# Patient Record
Sex: Female | Born: 1980 | Hispanic: No | Marital: Married | Smoking: Never smoker
Health system: Southern US, Community
[De-identification: ages and names within clinical notes are randomized; demographics above are authoritative.]

## PROBLEM LIST (undated history)

## (undated) DIAGNOSIS — I951 Orthostatic hypotension: Secondary | ICD-10-CM

## (undated) DIAGNOSIS — M5020 Other cervical disc displacement, unspecified cervical region: Secondary | ICD-10-CM

## (undated) DIAGNOSIS — D580 Hereditary spherocytosis: Secondary | ICD-10-CM

## (undated) HISTORY — DX: Hereditary spherocytosis: D58.0

## (undated) HISTORY — DX: Other cervical disc displacement, unspecified cervical region: M50.20

## (undated) HISTORY — DX: Orthostatic hypotension: I95.1

---
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
## 2005-08-01 ENCOUNTER — Other Ambulatory Visit: Admission: RE | Admit: 2005-08-01 | Discharge: 2005-08-01 | Payer: Self-pay | Admitting: Obstetrics and Gynecology

## 2007-08-06 ENCOUNTER — Other Ambulatory Visit: Admission: RE | Admit: 2007-08-06 | Discharge: 2007-08-06 | Payer: Self-pay | Admitting: Obstetrics and Gynecology

## 2007-09-11 ENCOUNTER — Other Ambulatory Visit: Admission: RE | Admit: 2007-09-11 | Discharge: 2007-09-11 | Payer: Self-pay | Admitting: Obstetrics and Gynecology

## 2010-04-01 LAB — HM PAP SMEAR: HM Pap smear: NORMAL

## 2012-04-21 ENCOUNTER — Encounter: Payer: Self-pay | Admitting: Nurse Practitioner

## 2012-05-10 ENCOUNTER — Ambulatory Visit: Payer: Self-pay | Admitting: Nurse Practitioner

## 2012-05-10 ENCOUNTER — Ambulatory Visit (INDEPENDENT_AMBULATORY_CARE_PROVIDER_SITE_OTHER): Payer: BC Managed Care – PPO | Admitting: Nurse Practitioner

## 2012-05-10 ENCOUNTER — Encounter: Payer: Self-pay | Admitting: Nurse Practitioner

## 2012-05-10 VITALS — BP 120/78 | HR 72 | Resp 16 | Ht 63.0 in | Wt 180.0 lb

## 2012-05-10 DIAGNOSIS — Z309 Encounter for contraceptive management, unspecified: Secondary | ICD-10-CM

## 2012-05-10 DIAGNOSIS — Z0000 Encounter for general adult medical examination without abnormal findings: Secondary | ICD-10-CM

## 2012-05-10 DIAGNOSIS — Z01419 Encounter for gynecological examination (general) (routine) without abnormal findings: Secondary | ICD-10-CM

## 2012-05-10 DIAGNOSIS — N946 Dysmenorrhea, unspecified: Secondary | ICD-10-CM

## 2012-05-10 MED ORDER — DROSPIRENONE-ETHINYL ESTRADIOL 3-0.03 MG PO TABS: 1.0000 | ORAL_TABLET | Freq: Every day | ORAL | Status: DC

## 2012-05-10 MED ORDER — NAPROXEN 250 MG PO TABS: 250.0000 mg | ORAL_TABLET | ORAL | Status: DC | PRN

## 2012-05-10 NOTE — Patient Instructions (Addendum)

## 2012-05-10 NOTE — Progress Notes (Signed)
32 y.o. G0P0000 Caucasian Fe here for annual exam.  Menses is normal, still some dysmenorrhea and needs to take Naprosyn 4-6 tabs per month. Now on Minocycline for acne and to return to dermatologist in 8 wk's.  Planning a destination  wedding Jun 16, 2012 in [place].  About 50 members of their families are going.  Patient's last menstrual period was 05/07/2012.          Sexually active: yes  The current method of family planning is OCP (estrogen/progesterone).    Exercising: yes  Home exercise routine includes stretching. Smoker:  no  Health Maintenance: Pap:  04/01/10 MMG:  NA Colonoscopy:  NA BMD:   NA TDaP:  2008 Labs: pap and HR HPV   reports that she has never smoked. She has never used smokeless tobacco. She reports that she drinks about 0.5 ounces of alcohol per week. She reports that she does not use illicit drugs.  Past Medical History  Diagnosis Date  . Hypotension, postural     Pt. placed on Zoloft for this problem - off currently  . Spherocytosis, hereditary 1985    family members also have, father and 2 sisters and 2 nieces    Past Surgical History  Procedure Laterality Date  . 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 Ankle fracture surgery Left 1999    Current Outpatient Prescriptions  Medication Sig Dispense Refill  . minocycline (MINOCIN,DYNACIN) 100 MG capsule Take 100 mg by mouth 2 (two) times daily.      . drospirenone-ethinyl estradiol (YASMIN,ZARAH,SYEDA) 3-0.03 MG tablet Take 1 tablet by mouth daily. On Yasmin since age 18 for menorrhagia      . naproxen (NAPROSYN) 250 MG tablet Take 250 mg by mouth as needed.       No current facility-administered medications for this visit.    Family History  Problem Relation Age of Onset  . Heart disease Mother   . Other Sister     spherocytosis  . Other Sister     spherocytosis    ROS:  Pertinent items are noted in HPI.  Otherwise, a comprehensive ROS was negative.  Exam:   BP 120/78   Pulse 72  Resp 16  Ht 5\' 3"  (1.6 m)  Wt 180 lb (81.647 kg)  BMI 31.89 kg/m2  LMP 05/07/2012  Weight change: @WEIGHT  CHANGE@ Height:   Height: 5\' 3"  (160 cm)  Ht Readings from Last 3 Encounters:  05/10/12 5\' 3"  (1.6 m)    General appearance: alert, cooperative and appears stated age Head: Normocephalic, without obvious abnormality, atraumatic Neck: no adenopathy, supple, symmetrical, trachea midline and thyroid normal to inspection and palpation Lungs: clear to auscultation bilaterally Breasts: normal appearance, no masses or tenderness Heart: regular rate and rhythm Abdomen: soft, non-tender; bowel sounds normal; no masses,  no organomegaly Extremities: extremities normal, atraumatic, no cyanosis or edema Skin: Skin color, texture, turgor normal. No rashes or lesions Lymph nodes: Cervical, supraclavicular, and axillary nodes normal. No abnormal inguinal nodes palpated Neurologic: Grossly normal   Pelvic: External genitalia:  no lesions              Urethra:  normal appearing urethra with no masses, tenderness or lesions              Bartholin's and Skene's: normal                 Vagina: normal appearing vagina with normal color and discharge, no lesions  Cervix: anteverted              Pap taken: yes Bimanual Exam:  Uterus:  normal size, contour, position, consistency, mobility, non-tender              Adnexa: normal adnexa               Rectovaginal: Confirms               Anus:  normal sphincter tone, no lesions  A:  Well Woman with normal exam  No contraindications to continued OCP  Hx. Acne - trying to clear before the wedding  P:   pap smear as indicated per guidelines  return annually or prn  An After Visit Summary was printed and given to the patient.

## 2012-05-13 NOTE — Progress Notes (Signed)
Encounter reviewed by Dr. Brook Silva.  

## 2012-05-15 NOTE — Progress Notes (Signed)
Amber Yu,  [facility] pap guidelines say for the pathologists to report endometrial cells only in women 40 or over, so I am not sure why they reported them.  It is not uncommon to see endometrial cells in the first 10 - 12 days of the cycle.    If the patient has strong risk factors for endometrial cancer like irregular bleeding/anovulatory cycles, obesity, and/or family history of uterine cancer, and were not on her cycle, it could otherwise be worth investigating.  Conley Simmonds, M.D.

## 2012-07-08 DIAGNOSIS — M5020 Other cervical disc displacement, unspecified cervical region: Secondary | ICD-10-CM

## 2012-07-08 HISTORY — DX: Other cervical disc displacement, unspecified cervical region: M50.20

## 2012-07-24 ENCOUNTER — Other Ambulatory Visit: Payer: Self-pay | Admitting: Sports Medicine

## 2012-07-24 DIAGNOSIS — M5412 Radiculopathy, cervical region: Secondary | ICD-10-CM

## 2012-07-27 ENCOUNTER — Other Ambulatory Visit: Payer: Self-pay | Admitting: Neurological Surgery

## 2012-07-27 DIAGNOSIS — M542 Cervicalgia: Secondary | ICD-10-CM

## 2012-07-29 ENCOUNTER — Other Ambulatory Visit: Payer: Self-pay

## 2012-07-31 ENCOUNTER — Other Ambulatory Visit: Payer: Self-pay

## 2012-08-23 ENCOUNTER — Ambulatory Visit: Admit: 2012-08-23 | Payer: Self-pay | Admitting: Neurological Surgery

## 2012-08-23 SURGERY — ANTERIOR CERVICAL DECOMPRESSION/DISCECTOMY FUSION 1 LEVEL
Anesthesia: General

## 2013-05-16 ENCOUNTER — Ambulatory Visit (INDEPENDENT_AMBULATORY_CARE_PROVIDER_SITE_OTHER): Payer: BC Managed Care – PPO | Admitting: Nurse Practitioner

## 2013-05-16 ENCOUNTER — Encounter: Payer: Self-pay | Admitting: Nurse Practitioner

## 2013-05-16 VITALS — BP 110/76 | HR 80 | Ht 64.0 in | Wt 201.0 lb

## 2013-05-16 DIAGNOSIS — Z3009 Encounter for other general counseling and advice on contraception: Secondary | ICD-10-CM

## 2013-05-16 DIAGNOSIS — Z0000 Encounter for general adult medical examination without abnormal findings: Secondary | ICD-10-CM

## 2013-05-16 DIAGNOSIS — Z309 Encounter for contraceptive management, unspecified: Secondary | ICD-10-CM

## 2013-05-16 DIAGNOSIS — Z23 Encounter for immunization: Secondary | ICD-10-CM

## 2013-05-16 DIAGNOSIS — Z01419 Encounter for gynecological examination (general) (routine) without abnormal findings: Secondary | ICD-10-CM

## 2013-05-16 LAB — HEMOGLOBIN, FINGERSTICK: Hemoglobin, fingerstick: 15 g/dL (ref 12.0–16.0)

## 2013-05-16 MED ORDER — NAPROXEN 250 MG PO TABS: 250.0000 mg | ORAL_TABLET | ORAL | Status: DC | PRN

## 2013-05-16 MED ORDER — DROSPIRENONE-ETHINYL ESTRADIOL 3-0.03 MG PO TABS: 1.0000 | ORAL_TABLET | Freq: Every day | ORAL | Status: DC

## 2013-05-16 NOTE — Progress Notes (Signed)
Patient ID: Amber Yu, female   DOB: Nov 29, 1980, 33 y.o.   MRN: [id number] 33 y.o. G0P0 Married Caucasian Fe here for annual exam.  Got married Jun 16 2012.   Maybe considering pregnancy this summer.   Had sudden onset of cervical neck pain and saw neurosurgeon and had epidural steroid injections.  Patient's last menstrual period was 05/09/2013.          Sexually active: yes  The current method of family planning is OCP (estrogen/progesterone) and condoms all of the time.    Exercising: yes  Gym/ health club routine includes cardio and and some weight lifting 2-3 times per week. Smoker:  no  Health Maintenance: Pap:  05/10/12, WNL, neg HR HPV TD:  2005 after an injury to foot Gardasil:  completed in 2007 Labs:  HB:  15.0 Urine:  Negative    reports that she has never smoked. She has never used smokeless tobacco. She reports that she drinks about .5 ounces of alcohol per week. She reports that she does not use illicit drugs.  Past Medical History  Diagnosis Date  . Hypotension, postural     Pt. placed on Zoloft for this problem - off currently  . Spherocytosis, hereditary 1985    family members also have, father and 2 sisters and 2 nieces  . Herniated disc, cervical 07/2012    treated with 4 epidural injections    Past Surgical History  Procedure Laterality Date  . 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 Ankle fracture surgery Left 1999    Current Outpatient Prescriptions  Medication Sig Dispense Refill  . drospirenone-ethinyl estradiol (YASMIN,ZARAH,SYEDA) 3-0.03 MG tablet Take 1 tablet by mouth daily. On Yasmin since age 27 for menorrhagia  3 Package  3  . naproxen (NAPROSYN) 250 MG tablet Take 1 tablet (250 mg total) by mouth as needed.  30 tablet  12   No current facility-administered medications for this visit.    Family History  Problem Relation Age of Onset  . Heart disease Mother   . Other Sister     spherocytosis  . Other Sister    spherocytosis    ROS:  Pertinent items are noted in HPI.  Otherwise, a comprehensive ROS was negative.  Exam:   BP 110/76  Pulse 80  Ht 5\' 4"  (1.626 m)  Wt 201 lb (91.173 kg)  BMI 34.48 kg/m2  LMP 05/09/2013 Height: 5\' 4"  (162.6 cm)  Ht Readings from Last 3 Encounters:  05/16/13 5\' 4"  (1.626 m)  05/10/12 5\' 3"  (1.6 m)    General appearance: alert, cooperative and appears stated age Head: Normocephalic, without obvious abnormality, atraumatic Neck: no adenopathy, supple, symmetrical, trachea midline and thyroid normal to inspection and palpation Lungs: clear to auscultation bilaterally Breasts: normal appearance, no masses or tenderness Heart: regular rate and rhythm Abdomen: soft, non-tender; no masses,  no organomegaly Extremities: extremities normal, atraumatic, no cyanosis or edema Skin: Skin color, texture, turgor normal. No rashes or lesions Lymph nodes: Cervical, supraclavicular, and axillary nodes normal. No abnormal inguinal nodes palpated Neurologic: Grossly normal   Pelvic: External genitalia:  no lesions              Urethra:  normal appearing urethra with no masses, tenderness or lesions              Bartholin's and Skene's: normal                 Vagina: normal appearing vagina with  normal color and discharge, no lesions              Cervix: anteverted              Pap taken: no Bimanual Exam:  Uterus:  normal size, contour, position, consistency, mobility, non-tender              Adnexa: no mass, fullness, tenderness               Rectovaginal: Confirms               Anus:  normal sphincter tone, no lesions  A:  Well Woman with normal exam  Contraception with OCP  Maybe considering pregnancy this summer  P:   Pap smear as per guidelines not done  Refill on OCP - Yasmin for a year  Refill Naprosyn prn dysmenorrhea  Will check rubella titer as she is unsure of status  Will update TDaP today   Counseled on breast self exam, use and side effects of OCP's,  family planning choices, adequate intake of calcium and vitamin D, diet and exercise return annually or prn  An After Visit Summary was printed and given to the patient.

## 2013-05-16 NOTE — Patient Instructions (Signed)

## 2013-05-17 LAB — RUBELLA SCREEN: Rubella: 3.49 Index — ABNORMAL HIGH (ref <0.90)

## 2013-05-20 NOTE — Progress Notes (Signed)
Encounter reviewed by Dr. Brook Silva.  

## 2013-08-22 ENCOUNTER — Encounter: Payer: Self-pay | Admitting: Nurse Practitioner

## 2013-08-22 ENCOUNTER — Telehealth: Payer: Self-pay | Admitting: *Deleted

## 2013-08-22 ENCOUNTER — Telehealth: Payer: Self-pay | Admitting: Nurse Practitioner

## 2013-08-22 ENCOUNTER — Ambulatory Visit (INDEPENDENT_AMBULATORY_CARE_PROVIDER_SITE_OTHER): Payer: BC Managed Care – PPO | Admitting: Nurse Practitioner

## 2013-08-22 ENCOUNTER — Other Ambulatory Visit: Payer: Self-pay | Admitting: *Deleted

## 2013-08-22 VITALS — BP 136/70 | HR 80 | Temp 98.1°F | Resp 18 | Ht 64.0 in | Wt 203.0 lb

## 2013-08-22 DIAGNOSIS — R1031 Right lower quadrant pain: Secondary | ICD-10-CM

## 2013-08-22 DIAGNOSIS — N912 Amenorrhea, unspecified: Secondary | ICD-10-CM

## 2013-08-22 DIAGNOSIS — R300 Dysuria: Secondary | ICD-10-CM

## 2013-08-22 LAB — POCT URINALYSIS DIPSTICK
Bilirubin, UA: NEGATIVE
GLUCOSE UA: NEGATIVE
Ketones, UA: NEGATIVE
Leukocytes, UA: NEGATIVE
Nitrite, UA: NEGATIVE
Protein, UA: NEGATIVE
RBC UA: NEGATIVE
Urobilinogen, UA: NEGATIVE
pH, UA: 5

## 2013-08-22 LAB — CBC WITH DIFFERENTIAL/PLATELET
Basophils Absolute: 0 10*3/uL (ref 0.0–0.1)
Basophils Relative: 0 % (ref 0–1)
Eosinophils Absolute: 0.2 10*3/uL (ref 0.0–0.7)
Eosinophils Relative: 1 % (ref 0–5)
HCT: 41.7 % (ref 36.0–46.0)
HEMOGLOBIN: 15 g/dL (ref 12.0–15.0)
LYMPHS ABS: 4.2 10*3/uL — AB (ref 0.7–4.0)
Lymphocytes Relative: 26 % (ref 12–46)
MCH: 28.4 pg (ref 26.0–34.0)
MCHC: 36 g/dL (ref 30.0–36.0)
MCV: 78.8 fL (ref 78.0–100.0)
MONO ABS: 1 10*3/uL (ref 0.1–1.0)
MONOS PCT: 6 % (ref 3–12)
NEUTROS ABS: 10.8 10*3/uL — AB (ref 1.7–7.7)
Neutrophils Relative %: 67 % (ref 43–77)
Platelets: 783 10*3/uL — ABNORMAL HIGH (ref 150–400)
RBC: 5.29 MIL/uL — ABNORMAL HIGH (ref 3.87–5.11)
RDW: 13.8 % (ref 11.5–15.5)
WBC: 16.1 10*3/uL — ABNORMAL HIGH (ref 4.0–10.5)

## 2013-08-22 LAB — HCG, SERUM, QUALITATIVE: PREG SERUM: NEGATIVE

## 2013-08-22 LAB — POCT URINE PREGNANCY: Preg Test, Ur: NEGATIVE

## 2013-08-22 NOTE — Telephone Encounter (Signed)
Spoke with patient. She states she has been having increased cramping over last 5 days. Felt like she was starting her cycle and attributed cramps to that. Having pain in ovaries as well per patient. Also, feels pain with urination.  Patient states she is not taking Yasmin. Is sexually active and not trying for pregnancy but protecting from pregnancy either.  Offered office visit with Milford Cage, [place] at 1015 today and patient agreeable.  Routing to provider for final review. Patient agreeable to disposition. Will close encounter

## 2013-08-22 NOTE — Telephone Encounter (Signed)
Imaging hold placed.   

## 2013-08-22 NOTE — Patient Instructions (Signed)
Will call you with test results.

## 2013-08-22 NOTE — Telephone Encounter (Signed)
Patient is calling Says she is 6 days late and starting her cycle. She is having severe cramping and took a pregnancy test this morning but it said negative. Wants to come in and see patty to figure out what is going on.

## 2013-08-22 NOTE — Telephone Encounter (Signed)
Call to patient, notified of CT scan scheduled tomm 09-23-13 at 1230, arrive 1215. At [employer] location of [facility]. Instructed needs to go there to pick up contast medium to drink and they will give her additional instruction regarding NPO start time. Given phone number to call if questions.  Per [facility], [place], with diag of RLQ pain and elevated WBC, they usually only do the CT with contrast. She will correct order in computer.  Routing to provider for final review. Patient agreeable to disposition. Will close encounter  Gabriel Cirri, just FYI for insurance. Olivia Mackie, put in hold for report.

## 2013-08-22 NOTE — Progress Notes (Signed)
WBC ct 16K.  Serum pregnancy is negative.  RLQ pain.  Feel should proceed with CT abd/pelvis with/without contrast.  Reviewed personally.  Felipa Emory, MD.

## 2013-08-22 NOTE — Progress Notes (Signed)
Subjective:     Patient ID: Amber Yu, female   DOB: Jun 24, 1980, 33 y.o.   MRN: [id number]  HPI   This 33 yo WM Fe complains of sudden onset of right lower abdominal pressure and discomfort about 5 days ago.  She thought this was the onset of her menses and took some Advil,  Pain has persisted but not worsened.  She is very concerned about a pregnancy.  She came off OCP last June anticipating needing to have neck surgery.  She did not have the surgery but decided to give herself a break and remain off OCP and used condoms.  She has been doing that until last month; when she used no method of birth control.  Her menses on May 11 th was 9 days late. Seemed heavier flow and lasted for 7 days.  Then on June 9 th  another cycle that lasted her normal flow of 4 days.  Now, no menses yet this month.  Had some bloating and cramps about the same time of this lower pelvic pain.  Denies any urinary or fever/ chills.  She does have a little loose stool but she gets this prior to her menses. No nausea but appetite not as normal.  Home UPT was negative.   Review of Systems  Constitutional: Negative for fever, chills, diaphoresis, fatigue and unexpected weight change.  Respiratory: Negative.   Cardiovascular: Negative.   Gastrointestinal: Positive for abdominal pain. Negative for nausea, vomiting, diarrhea, constipation, blood in stool, abdominal distention, anal bleeding and rectal pain.  Genitourinary: Positive for urgency, menstrual problem and pelvic pain. Negative for dysuria, frequency, hematuria, flank pain, decreased urine volume, vaginal bleeding, vaginal discharge, genital sores, vaginal pain and dyspareunia.  Skin: Negative.   Neurological: Negative.   Psychiatric/Behavioral: Negative.        Objective:   Physical Exam  Constitutional: She is oriented to person, place, and time. She appears well-developed and well-nourished. No distress.  Abdominal: Soft. Bowel sounds are normal. She exhibits no  distension and no mass. There is tenderness. There is no rebound and no guarding.  No point tenderness - seems more vague in area of RLQ.  Genitourinary:  Normal vaginal discharge.  No signs of cervicitis or bleeding.  No pain on bimanual and no mass felt.  Unable to reproduce the same pain.  On rectal exam - no mass or constipaton. Again unablet to reproduce the same pain.  Neurological: She is alert and oriented to person, place, and time.  Psychiatric: She has a normal mood and affect. Her behavior is normal. Judgment and thought content normal.       Assessment:     RLQ pain Amenorrhea off OCP No method of BC last month    Plan:     STAT CBC and Serum HCG and follow Do not believe GYN origin - but maybe GI origin

## 2013-08-23 ENCOUNTER — Telehealth: Payer: Self-pay | Admitting: Nurse Practitioner

## 2013-08-23 ENCOUNTER — Ambulatory Visit
Admission: RE | Admit: 2013-08-23 | Discharge: 2013-08-23 | Disposition: A | Discharge status: Home / Self Care | Payer: BC Managed Care – PPO | Source: Ambulatory Visit | Attending: Obstetrics & Gynecology | Admitting: Obstetrics & Gynecology

## 2013-08-23 DIAGNOSIS — N83201 Unspecified ovarian cyst, right side: Secondary | ICD-10-CM

## 2013-08-23 DIAGNOSIS — R1031 Right lower quadrant pain: Secondary | ICD-10-CM

## 2013-08-23 MED ORDER — IOHEXOL 300 MG/ML  SOLN
125.0000 mL | Freq: Once | INTRAMUSCULAR | Status: AC | PRN
  Administered 2013-08-23: 125 mL via INTRAVENOUS

## 2013-08-23 NOTE — Telephone Encounter (Signed)
Patient calling to see if CT results are back from the test she had today.

## 2013-08-23 NOTE — Telephone Encounter (Addendum)
Patient given message from Dr. Sabra Heck.  She is agreeable to follow up as recommended. States she is still having RLQ pain but that is has improved "some".  Scheduled office visit with Milford Cage, FNP for Tuesday.  Scheduled PUS in 6 weeks with Dr. Sabra Heck.  Advised to call back any time if pain worsens, develops fevers or any other concerns. Can take Aleve as directed by Milford Cage, FNP.  Routing to [place] for precert Pelvic ultrasound. Link order after patient notification.  Routing to provider for final review. Patient agreeable to disposition. Will close encounter

## 2013-08-23 NOTE — Telephone Encounter (Signed)
Amber Yu @[facility]  [facility] is calling for insurance authorization. The patient has an appointment today @ 10 and will have to be rescheduled if this is not done.

## 2013-08-23 NOTE — Telephone Encounter (Signed)
Message copied by Michele Mcalpine on Fri Aug 23, 2013  4:45 PM ------      Message from: Megan Salon      Created: Fri Aug 23, 2013  4:44 PM       Please inform pt she has a 3.6cm right ovarian cyst that looks like it is a hemorrhagic cyst.  This means it has bled into itself.  I'd like her to see Patty again next week and have a repeat CBC done.  Also, need repeat PUS in 6-8 weeks with me to see resolution of cyst.  Thanks. ------

## 2013-08-23 NOTE — Telephone Encounter (Signed)
Routing to Dr. Sabra Heck for result note.

## 2013-08-27 ENCOUNTER — Ambulatory Visit (INDEPENDENT_AMBULATORY_CARE_PROVIDER_SITE_OTHER): Payer: BC Managed Care – PPO

## 2013-08-27 ENCOUNTER — Ambulatory Visit (INDEPENDENT_AMBULATORY_CARE_PROVIDER_SITE_OTHER): Payer: BC Managed Care – PPO | Admitting: Gynecology

## 2013-08-27 ENCOUNTER — Encounter: Payer: Self-pay | Admitting: Nurse Practitioner

## 2013-08-27 ENCOUNTER — Other Ambulatory Visit: Payer: Self-pay | Admitting: *Deleted

## 2013-08-27 ENCOUNTER — Ambulatory Visit (INDEPENDENT_AMBULATORY_CARE_PROVIDER_SITE_OTHER): Payer: BC Managed Care – PPO | Admitting: Nurse Practitioner

## 2013-08-27 VITALS — BP 116/74 | HR 76 | Temp 97.8°F | Ht 64.0 in | Wt 205.0 lb

## 2013-08-27 DIAGNOSIS — I951 Orthostatic hypotension: Secondary | ICD-10-CM | POA: Insufficient documentation

## 2013-08-27 DIAGNOSIS — D580 Hereditary spherocytosis: Secondary | ICD-10-CM | POA: Insufficient documentation

## 2013-08-27 DIAGNOSIS — Z30011 Encounter for initial prescription of contraceptive pills: Secondary | ICD-10-CM

## 2013-08-27 DIAGNOSIS — N83209 Unspecified ovarian cyst, unspecified side: Secondary | ICD-10-CM

## 2013-08-27 DIAGNOSIS — N83201 Unspecified ovarian cyst, right side: Secondary | ICD-10-CM

## 2013-08-27 DIAGNOSIS — Z3009 Encounter for other general counseling and advice on contraception: Secondary | ICD-10-CM

## 2013-08-27 DIAGNOSIS — N912 Amenorrhea, unspecified: Secondary | ICD-10-CM

## 2013-08-27 DIAGNOSIS — N831 Corpus luteum cyst of ovary, unspecified side: Secondary | ICD-10-CM

## 2013-08-27 LAB — CBC WITH DIFFERENTIAL/PLATELET
BASOS ABS: 0 10*3/uL (ref 0.0–0.1)
BASOS PCT: 0 % (ref 0–1)
EOS ABS: 0.2 10*3/uL (ref 0.0–0.7)
Eosinophils Relative: 2 % (ref 0–5)
HCT: 40.6 % (ref 36.0–46.0)
Hemoglobin: 14.4 g/dL (ref 12.0–15.0)
Lymphocytes Relative: 34 % (ref 12–46)
Lymphs Abs: 4.2 10*3/uL — ABNORMAL HIGH (ref 0.7–4.0)
MCH: 28.1 pg (ref 26.0–34.0)
MCHC: 35.5 g/dL (ref 30.0–36.0)
MCV: 79.3 fL (ref 78.0–100.0)
Monocytes Absolute: 1.1 10*3/uL — ABNORMAL HIGH (ref 0.1–1.0)
Monocytes Relative: 9 % (ref 3–12)
NEUTROS PCT: 55 % (ref 43–77)
Neutro Abs: 6.8 10*3/uL (ref 1.7–7.7)
Platelets: 806 10*3/uL — ABNORMAL HIGH (ref 150–400)
RBC: 5.12 MIL/uL — ABNORMAL HIGH (ref 3.87–5.11)
RDW: 13.9 % (ref 11.5–15.5)
WBC: 12.3 10*3/uL — ABNORMAL HIGH (ref 4.0–10.5)

## 2013-08-27 MED ORDER — MEDROXYPROGESTERONE ACETATE 10 MG PO TABS: 10.0000 mg | ORAL_TABLET | Freq: Every day | ORAL | Status: DC

## 2013-08-27 MED ORDER — DROSPIRENONE-ETHINYL ESTRADIOL 3-0.03 MG PO TABS: 1.0000 | ORAL_TABLET | Freq: Every day | ORAL | Status: DC

## 2013-08-27 NOTE — Progress Notes (Signed)
Subjective:     Patient ID: Amber Yu, female   DOB: [birth year], 33 y.o.   MRN: [id number]  HPI HPI This 33 yo WM Fe  Comes in for a recheck of a sudden onset of right lower abdominal pressure and discomfort that started about 10 days ago.   She thought this was the onset of her menses and took some Advil.  Pain has persisted but not worsened. last week as part of her evaluation she had an elevated WBC of 16.3 with a negative serum HCG.  She then went for a CT scan and was found to have 3.6 cm right hemorrhagic ovarian  cyst.  She now states pain is some better and having less pain.  She had been very concerned about a pregnancy. She came off OCP last June 2014 anticipating needing to have neck surgery. She did not have the surgery but decided to give herself a break and remain off OCP and used condoms. She has been doing that until last month; when she used no method of birth control. Her menses on May 11 th was 9 days late. Seemed heavier flow and lasted for 7 days. Then on June 9 th another cycle that lasted her normal flow of 4 days. Now, no menses yet this month. Had some bloating and cramps about the same time of this lower pelvic pain. Denies any urinary or fever/ chills.  Home UPT was negative.   Review of Systems  Constitutional: Negative for fever, chills and fatigue.  Gastrointestinal: Negative for nausea, vomiting, abdominal pain, diarrhea, constipation and abdominal distention.  Genitourinary: Negative for dysuria, flank pain, vaginal bleeding, vaginal discharge and vaginal pain.       Not SA since last appointment.  Musculoskeletal: Negative.   Skin: Negative.   Neurological: Negative.   Psychiatric/Behavioral: Negative.        Objective:   Physical Exam  Constitutional: She is oriented to person, place, and time. She appears well-developed and well-nourished. No distress.  Abdominal: Soft. She exhibits no distension. There is tenderness. There is no rebound and no guarding.   No flank pain  Genitourinary:  No vaginal discharge.  On bimanual exam very little if any pain on the left.  Increased discomfort on the right. On exam she has a "pulsating" feeling on her exam.  Not felt time after time on the left - but could feel this on her right side.  Still pain with manipulation.  I asked Dr. Charlies Constable to exam patient as well.  Neurological: She is alert and oriented to person, place, and time.  Psychiatric: She has a normal mood and affect. Her behavior is normal. Judgment and thought content normal.       Assessment:     RLQ pain X 10 days with current amenorrhea CT Scan reveals a hemorrhagic cyst on the right. History of spherocytosis with prior splenectomy 1987    Plan:     Patient seen by Dr. Charlies Constable Will get PUS today and follow Repeat CBC done stat.

## 2013-08-27 NOTE — Patient Instructions (Signed)
Wait for results of pregnancy test Start provera Start ocp when bleeding begins and stop provera

## 2013-08-27 NOTE — Progress Notes (Signed)
     Pt seen in office earlier today for folllow up of elevated WBC, right ovarian mass on CT with normal appendix, right lower quadrant pain.  Pt is using condoms for contraception, cycles were normal until recently, missed May, cycle in June, none since.  Was interested in restarting ocp. Pelvic exam felt "different" from last week per P Grubb.  PUS images reviewed with pt.     Uterus is normal 8x4.9x3.4cm EMS thickened c/w lack of menses-10. Right adnexa with simple cyst 5.8x5.5x5.3, increased in size from CT, normal peripheral blood flow.  Suspect functional cyst.  Recommend repeating PUS in 4-6w. Will repeat HCG-neg 7/17, and start menses with provera, then resume ocp. Pt agreeable Repeat cbc with decrease wbc 12.3-pt is s/p splenectomy  6m spent counseling, >50% face to face

## 2013-08-28 ENCOUNTER — Telehealth: Payer: Self-pay

## 2013-08-28 LAB — HCG, SERUM, QUALITATIVE: PREG SERUM: NEGATIVE

## 2013-08-28 NOTE — Telephone Encounter (Signed)
Message copied by Jasmine Awe on Wed Aug 28, 2013 10:00 AM ------      Message from: [place], [place]: Wed Aug 28, 2013  9:41 AM       Inform pregnancy test negative, should start provera-has rx ------

## 2013-08-28 NOTE — Telephone Encounter (Signed)
Spoke with patient. Advised of message as seen below from Dr.Lathrop. Patient agreeable and verbalizes understanding. Will start provera today.  Routing to provider for final review. Patient agreeable to disposition. Will close encounter

## 2013-08-29 NOTE — Progress Notes (Signed)
Note reviewed, agree with plan.  Soliana Kitko, MD  

## 2013-08-30 ENCOUNTER — Encounter (HOSPITAL_COMMUNITY): Payer: Self-pay

## 2013-08-30 ENCOUNTER — Telehealth: Payer: Self-pay | Admitting: Nurse Practitioner

## 2013-08-30 ENCOUNTER — Inpatient Hospital Stay (HOSPITAL_COMMUNITY)
Admission: AD | Admit: 2013-08-30 | Discharge: 2013-08-30 | Disposition: A | Discharge status: Home / Self Care | Payer: BC Managed Care – PPO | Source: Ambulatory Visit | Attending: Gynecology | Admitting: Gynecology

## 2013-08-30 DIAGNOSIS — N83201 Unspecified ovarian cyst, right side: Secondary | ICD-10-CM

## 2013-08-30 DIAGNOSIS — N83209 Unspecified ovarian cyst, unspecified side: Secondary | ICD-10-CM | POA: Insufficient documentation

## 2013-08-30 LAB — URINALYSIS, ROUTINE W REFLEX MICROSCOPIC
BILIRUBIN URINE: NEGATIVE
Glucose, UA: NEGATIVE mg/dL
Hgb urine dipstick: NEGATIVE
KETONES UR: NEGATIVE mg/dL
Leukocytes, UA: NEGATIVE
NITRITE: NEGATIVE
PH: 5 (ref 5.0–8.0)
PROTEIN: NEGATIVE mg/dL
Specific Gravity, Urine: <1.005 — ABNORMAL LOW (ref 1.005–1.030)
Urobilinogen, UA: 0.2 mg/dL (ref 0.0–1.0)

## 2013-08-30 LAB — POCT PREGNANCY, URINE: Preg Test, Ur: NEGATIVE

## 2013-08-30 NOTE — Telephone Encounter (Signed)
Patient wants to talk to a nurse about her ovarian cyst.

## 2013-08-30 NOTE — Telephone Encounter (Signed)
Spoke with patient. Patient states that she has been experiencing "A muscle spasm in my lower back and side and I hurt in the front on the side where my ovarian cyst is." Patient was seen on 4/21 with Dr.Lathrop for ultrasound. States that pain is a 5/10 and is constant. Has not taken anything for pain relief today. Denies fever, and nausea. States that she did do extra work yesterday but would like to make sure that this is "normal with a cyst." Advised would check with another provider as Dr.Lathrop has left for the day. Patient is agreeable.

## 2013-08-30 NOTE — Telephone Encounter (Signed)
Spoke with patient. Advised per nurse manager patient should start taking motrin, and use a heating pad for relief. Heating pad precautions given. Advised not to place heating pad directly on skin and do not leave plugged in while sleeping or not in the room. Advised patient to monitor pain and if pain worsens or changes or any new symptoms arise over the weekend will need to be seen at [facility]. If symptoms remain the same without worsening on Monday will need to call to be seen in the office for evaluation. Patient is agreeable and verbalizes understanding.  Routing to provider for final review. Patient agreeable to disposition. Will close encounter

## 2013-08-30 NOTE — Discharge Instructions (Signed)
Ovarian Cyst An ovarian cyst is a fluid-filled sac that forms on an ovary. The ovaries are small organs that produce eggs in women. Various types of cysts can form on the ovaries. Most are not cancerous. Many do not cause problems, and they often go away on their own. Some may cause symptoms and require treatment. Common types of ovarian cysts include:  Functional cysts--These cysts may occur every month during the menstrual cycle. This is normal. The cysts usually go away with the next menstrual cycle if the woman does not get pregnant. Usually, there are no symptoms with a functional cyst.     ### this is the type you have  ###    Endometrioma cysts--These cysts form from the tissue that lines the uterus. They are also called "chocolate cysts" because they become filled with blood that turns brown. This type of cyst can cause pain in the lower abdomen during intercourse and with your menstrual period.  Cystadenoma cysts--This type develops from the cells on the outside of the ovary. These cysts can get very big and cause lower abdomen pain and pain with intercourse. This type of cyst can twist on itself, cut off its blood supply, and cause severe pain. It can also easily rupture and cause a lot of pain.  Dermoid cysts--This type of cyst is sometimes found in both ovaries. These cysts may contain different kinds of body tissue, such as skin, teeth, hair, or cartilage. They usually do not cause symptoms unless they get very big.  Theca lutein cysts--These cysts occur when too much of a certain hormone (human chorionic gonadotropin) is produced and overstimulates the ovaries to produce an egg. This is most common after procedures used to assist with the conception of a baby (in vitro fertilization). CAUSES   Fertility drugs can cause a condition in which multiple large cysts are formed on the ovaries. This is called ovarian hyperstimulation syndrome.  A condition called polycystic ovary syndrome  can cause hormonal imbalances that can lead to nonfunctional ovarian cysts. SIGNS AND SYMPTOMS  Many ovarian cysts do not cause symptoms. If symptoms are present, they may include:  Pelvic pain or pressure.  Pain in the lower abdomen.  Pain during sexual intercourse.  Increasing girth (swelling) of the abdomen.  Abnormal menstrual periods.  Increasing pain with menstrual periods.  Stopping having menstrual periods without being pregnant. DIAGNOSIS  These cysts are commonly found during a routine or annual pelvic exam. Tests may be ordered to find out more about the cyst. These tests may include:  Ultrasound.  X-ray of the pelvis.  CT scan.  MRI.  Blood tests. TREATMENT  Many ovarian cysts go away on their own without treatment. Your health care provider may want to check your cyst regularly for 2-3 months to see if it changes. For women in menopause, it is particularly important to monitor a cyst closely because of the higher rate of ovarian cancer in menopausal women. When treatment is needed, it may include any of the following:  A procedure to drain the cyst (aspiration). This may be done using a long needle and ultrasound. It can also be done through a laparoscopic procedure. This involves using a thin, lighted tube with a tiny camera on the end (laparoscope) inserted through a small incision.  Surgery to remove the whole cyst. This may be done using laparoscopic surgery or an open surgery involving a larger incision in the lower abdomen.  Hormone treatment or birth control pills. These methods are  sometimes used to help dissolve a cyst. HOME CARE INSTRUCTIONS   Only take over-the-counter or prescription medicines as directed by your health care provider.  Follow up with your health care provider as directed.  Get regular pelvic exams and Pap tests. SEEK MEDICAL CARE IF:   Your periods are late, irregular, or painful, or they stop.  Your pelvic pain or abdominal  pain does not go away.  Your abdomen becomes larger or swollen.  You have pressure on your bladder or trouble emptying your bladder completely.  You have pain during sexual intercourse.  You have feelings of fullness, pressure, or discomfort in your stomach.  You lose weight for no apparent reason.  You feel generally ill.  You become constipated.  You lose your appetite.  You develop acne.  You have an increase in body and facial hair.  You are gaining weight, without changing your exercise and eating habits.  You think you are pregnant. SEEK IMMEDIATE MEDICAL CARE IF:   You have increasing abdominal pain.  You feel sick to your stomach (nauseous), and you throw up (vomit).  You develop a fever that comes on suddenly.  You have abdominal pain during a bowel movement.  Your menstrual periods become heavier than usual. MAKE SURE YOU:  Understand these instructions.  Will watch your condition.  Will get help right away if you are not doing well or get worse. Document Released: 01/24/2005 Document Revised: 01/29/2013 Document Reviewed: 10/01/2012 [facility] Patient Information 2015 [place], [place]. This information is not intended to replace advice given to you by your health care provider. Make sure you discuss any questions you have with your health care provider.

## 2013-08-30 NOTE — MAU Note (Signed)
I have had an ovarian cyst for couple wks. Pain was alittle different today so called my doctor and told to take Ibuprofen and if pain worsened to come here. Pain is no worse but leaving town tomorrow and just wanted to be sure it is not twisted.

## 2013-08-30 NOTE — MAU Provider Note (Signed)
History     CSN: [id number]  Arrival date and time: 08/30/13 2025   None     Chief Complaint  Patient presents with  . Ovarian Cyst   HPI This is a 33 y.o. female who presents with request to have ovarian cyst checked. Was seen this week in office by Dr Charlies Constable and diagnosed with a right ovarian cyst.  This had grown from 3cm to 5cm as of 3 days ago. States has changed from spasm feeling to cramp, and is traveling to [facility] tomorrow and wanted to make sure it was not torsed.   RN Note: I have had an ovarian cyst for couple wks. Pain was alittle different today so called my doctor and told to take Ibuprofen and if pain worsened to come here. Pain is no worse but leaving town tomorrow and just wanted to be sure it is not twisted.        OB History   Grav Para Term Preterm Abortions TAB SAB Ect Mult Living   0 0              Past Medical History  Diagnosis Date  . Hypotension, postural     Pt. placed on Zoloft for this problem - off currently  . Spherocytosis, hereditary 1985    family members also have, father and 2 sisters and 2 nieces  . Herniated disc, cervical 07/2012    treated with 4 epidural injections    Past Surgical History  Procedure Laterality Date  . 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 Ankle fracture surgery Left 1999    Family History  Problem Relation Age of Onset  . Heart disease Mother   . Other Sister     spherocytosis  . Other Sister     spherocytosis    History  Substance Use Topics  . Smoking status: Never Smoker   . Smokeless tobacco: Never Used  . Alcohol Use: 0.5 oz/week    1 drink(s) per week     Comment: once per month    Allergies:  Allergies  Allergen Reactions  . Minocycline Other (See Comments)    dizziness  . Latex Rash    Prescriptions prior to admission  Medication Sig Dispense Refill  . drospirenone-ethinyl estradiol (YASMIN,ZARAH,SYEDA) 3-0.03 MG tablet Take 1 tablet by mouth daily. On Yasmin  since age 29 for menorrhagia  3 Package  3  . medroxyPROGESTERone (PROVERA) 10 MG tablet Take 1 tablet (10 mg total) by mouth daily.  10 tablet  0  . naproxen (NAPROSYN) 250 MG tablet Take 1 tablet (250 mg total) by mouth as needed.  30 tablet  12    Review of Systems  Constitutional: Negative for fever, chills and malaise/fatigue.  Gastrointestinal: Positive for abdominal pain. Negative for nausea, vomiting, diarrhea and constipation.  Genitourinary: Negative for dysuria and flank pain.  Neurological: Negative for dizziness, weakness and headaches.   Physical Exam   Temperature 99 F (37.2 C), resp. rate 20, height 5\' 3"  (1.6 m), weight 93.25 kg (205 lb 9.3 oz), last menstrual period 07/16/2013.  Physical Exam  Constitutional: She is oriented to person, place, and time. She appears well-developed and well-nourished. No distress.  HENT:  Head: Normocephalic.  Cardiovascular: Normal rate.   Respiratory: Effort normal.  GI: Soft. She exhibits no distension and no mass. There is tenderness (slightly tender RLQ). There is no rebound and no guarding.  Genitourinary: Vagina normal and uterus normal. No vaginal discharge found.  No significant pain to palpation via bimanual exam on either adnexal region. Slight tenderness on right  Musculoskeletal: Normal range of motion.  Neurological: She is alert and oriented to person, place, and time.  Skin: Skin is warm and dry.  Psychiatric: She has a normal mood and affect.   [place] from 3 days ago states the Right ovarian simple cyst is 5cm  MAU Course  Procedures  MDM Is also on Provera for late cycle. Is on 3rd day. Told by Dr Charlies Constable that if she started bleeding, to stop taking the Provera. I described the way Provera is usually taken for 5-10 days and to expect bleeding sometime after last pill. But pt is to discuss  With Dr Charlies Constable for her instructions if bleeding starts.   Assessment and Plan  A:  Right ovarian cyst      No evidence of  torsion  P:  Discussed with Dr Phineas Real       Pt is to report any severe lower abdominal pain and go to nearest ED if occurs        Follow up in office  [facility] 08/30/2013, 10:03 PM

## 2013-10-03 ENCOUNTER — Ambulatory Visit (INDEPENDENT_AMBULATORY_CARE_PROVIDER_SITE_OTHER): Payer: BC Managed Care – PPO | Admitting: Obstetrics & Gynecology

## 2013-10-03 ENCOUNTER — Ambulatory Visit (INDEPENDENT_AMBULATORY_CARE_PROVIDER_SITE_OTHER): Payer: BC Managed Care – PPO

## 2013-10-03 VITALS — BP 136/84 | Wt 201.0 lb

## 2013-10-03 DIAGNOSIS — N938 Other specified abnormal uterine and vaginal bleeding: Secondary | ICD-10-CM

## 2013-10-03 DIAGNOSIS — N83209 Unspecified ovarian cyst, unspecified side: Secondary | ICD-10-CM

## 2013-10-03 DIAGNOSIS — N925 Other specified irregular menstruation: Secondary | ICD-10-CM

## 2013-10-03 DIAGNOSIS — N83201 Unspecified ovarian cyst, right side: Secondary | ICD-10-CM

## 2013-10-03 DIAGNOSIS — D72829 Elevated white blood cell count, unspecified: Secondary | ICD-10-CM

## 2013-10-03 DIAGNOSIS — N949 Unspecified condition associated with female genital organs and menstrual cycle: Secondary | ICD-10-CM

## 2013-10-03 NOTE — Progress Notes (Addendum)
33 y.o.Marriedfemale here for a pelvic ultrasound to reassess ovarian cyst.  Pt diagnosed 08/23/13 with 3.6cm right hemorrhagic ovarian cyst which was seen on CT scan.  Pt seen in office for follow-up.  PUS was performed 08/27/13 showing 5.8cm cyst, which was increased in sized from CT.  Due to increase in size, OCPs were recommended.  Pt was off all birth control as she was interested in starting to try for a pregnancy.  Cycle was later.  Pregnancy test was obtained and was negative.  Pt given provera withdrawal and then OCPs started.  Pt reports pain has completely resolved.  Her biggest concern was that she was told to stay on the OCPs for at least six months to a year and she really doesn't want to do this.  She really would like to try for pregnancy..   No LMP recorded. Patient is not currently having periods (Reason: Needs Pregnancy Test).  Sexually active:  yes  Contraception: oral contraceptives (estrogen/progesterone)  FINDINGS: UTERUS: 7.6 x 4.7 x 3.6cm EMS: symmetrical, no lesions noted ADNEXA:   Left ovary 2.6 x 2.0 x 1.5cm with 2.8cm cyst, thin walled, avascular with thin septation, echofree.   Right ovary 3.2 x 2.8 x 1.6cm with 1.1cm cyst, avascular but thick walled.  Located lateral and inferior aspect of right ovary. CUL DE SAC: no free fluid noted  Reviewed images with pt.  Although images are not "normal", the right ovarian cyst is much smaller or gone with presence of possible "new" 1.1cm cyst.  Either way, it is much smaller.  As pt is desirous of starting a family, advised to stop OCPs at end of pack or now.  Expect withdrawal bleeding.  Pt will keep menstrual calendar.  I am guessing she is having anovulatory cycles due to irregularity.  Day 3 FSH also discussed.  Pt to call with onset of cycle and have lab drawn.  If cycles continue to be irregular over next three months, advised F/U visit to discuss other treatment options and continued work up. Voices clear  understanding.  Assessment:  H/O 5.8cm right ovarian cyst, much small now Desired fertility/desired pregnancy H/O hereditary spherocytosis   Plan:  Stop birth control pills now. Start PNV. Expect cycle to be a little late this month (since pt is currently on the first week of pills).   Call with onset of next "real" cycle.   On day 3 of bleeding, return for fasting blood work.  Will also check for PCOS--fasting insulin, LH/FSH, 17-OHP, TSH If no cycle by day 30, pt to do home pregnancy test and call with results.  If neg, will use provera to trigger cycle and then plan Day 3 FSH.  All instructions written for pt.  Orders placed. Repeat CBC with labs Keep menstrual calendar.  ~25 minutes spent with patient >50% of time was in face to face discussion of above.

## 2013-10-03 NOTE — Patient Instructions (Signed)
Stop birth control pills now. Start PNV. Expect cycle to be a little late this month (since you are on the first week of your pills).   Call with onset of next "real" cycle.   On day 3 of bleeding, return for fasting blood work.   If no cycle by day 30, do home pregnancy test and call with results.  If neg, will use provera to trigger cycle.

## 2013-10-20 ENCOUNTER — Encounter: Payer: Self-pay | Admitting: Obstetrics & Gynecology

## 2013-10-20 NOTE — Addendum Note (Signed)
Addended by: Megan Salon on: 10/20/2013 02:39 PM   Modules accepted: Orders

## 2013-11-05 ENCOUNTER — Other Ambulatory Visit (INDEPENDENT_AMBULATORY_CARE_PROVIDER_SITE_OTHER): Payer: BC Managed Care – PPO

## 2013-11-05 DIAGNOSIS — N938 Other specified abnormal uterine and vaginal bleeding: Secondary | ICD-10-CM

## 2013-11-05 DIAGNOSIS — N949 Unspecified condition associated with female genital organs and menstrual cycle: Secondary | ICD-10-CM

## 2013-11-05 DIAGNOSIS — D72829 Elevated white blood cell count, unspecified: Secondary | ICD-10-CM

## 2013-11-05 DIAGNOSIS — N925 Other specified irregular menstruation: Secondary | ICD-10-CM

## 2013-11-05 LAB — CBC WITH DIFFERENTIAL/PLATELET
BASOS ABS: 0.1 10*3/uL (ref 0.0–0.1)
Basophils Relative: 1 % (ref 0–1)
Eosinophils Absolute: 0.3 10*3/uL (ref 0.0–0.7)
Eosinophils Relative: 3 % (ref 0–5)
HEMATOCRIT: 40.7 % (ref 36.0–46.0)
Hemoglobin: 14 g/dL (ref 12.0–15.0)
Lymphocytes Relative: 42 % (ref 12–46)
Lymphs Abs: 4.5 10*3/uL — ABNORMAL HIGH (ref 0.7–4.0)
MCH: 28.1 pg (ref 26.0–34.0)
MCHC: 34.4 g/dL (ref 30.0–36.0)
MCV: 81.6 fL (ref 78.0–100.0)
Monocytes Absolute: 1.1 10*3/uL — ABNORMAL HIGH (ref 0.1–1.0)
Monocytes Relative: 10 % (ref 3–12)
NEUTROS ABS: 4.7 10*3/uL (ref 1.7–7.7)
Neutrophils Relative %: 44 % (ref 43–77)
Platelets: 787 10*3/uL — ABNORMAL HIGH (ref 150–400)
RBC: 4.99 MIL/uL (ref 3.87–5.11)
RDW: 13.9 % (ref 11.5–15.5)
WBC: 10.6 10*3/uL — ABNORMAL HIGH (ref 4.0–10.5)

## 2013-11-06 LAB — INSULIN, FASTING: INSULIN FASTING, SERUM: 9.5 u[IU]/mL (ref 2.0–19.6)

## 2013-11-06 LAB — FSH/LH
FSH: 10.9 m[IU]/mL
LH: 5.6 m[IU]/mL

## 2013-11-06 LAB — TSH: TSH: 1.846 u[IU]/mL (ref 0.350–4.500)

## 2013-11-09 LAB — 17-HYDROXYPROGESTERONE: 17-OH-PROGESTERONE, LC/MS/MS: 77 ng/dL

## 2013-11-12 ENCOUNTER — Other Ambulatory Visit: Payer: Self-pay | Admitting: Obstetrics & Gynecology

## 2013-11-12 ENCOUNTER — Telehealth: Payer: Self-pay

## 2013-11-12 DIAGNOSIS — R7989 Other specified abnormal findings of blood chemistry: Secondary | ICD-10-CM

## 2013-11-12 NOTE — Telephone Encounter (Signed)
Lmtcb//kn 

## 2013-11-12 NOTE — Telephone Encounter (Signed)
Message copied by Robley Fries on Tue Nov 12, 2013  8:50 AM ------      Message from: Megan Salon      Created: Tue Nov 12, 2013  6:23 AM       Please call pt and inform TSH normal.  Fasting insulin normal, LH is fine.  Progesterone level is fine.  [place] is okay but on the high side of normal.  What day did she do the labs in relation to starting her cycle?            Also, I repeated her CBC as her platelet count was elevated when she first had the ovarian cyst.  It is still elevated.  I would like her to see Dr. Marin Olp just to make sure nothing else needs to be done.  I'm placing an order, too, for [place].  If she doesn't hear back from [place] about this appt within a week, please have her call back and ask for sabrina.  Thanks. ------

## 2013-11-12 NOTE — Telephone Encounter (Signed)
Patient notified of all results. See result note.//kn 

## 2013-11-19 ENCOUNTER — Telehealth: Payer: Self-pay | Admitting: Obstetrics & Gynecology

## 2013-11-19 NOTE — Telephone Encounter (Signed)
Patient has some additional questions about results she discussed with Claiborne Billings last week.

## 2013-11-21 NOTE — Telephone Encounter (Signed)
Spoke with patient-has appointment with Dr Marin Olp on 12/09/13. Is aware Dr Sabra Heck is going to talk with Dr Kerin Perna re: her [facility] level. Will call her back as soon as we know what the next step is.//kn

## 2013-12-06 ENCOUNTER — Telehealth: Payer: Self-pay | Admitting: Hematology & Oncology

## 2013-12-06 NOTE — Telephone Encounter (Signed)
Left vm w NEW PATIENT today to remind them of their appointment with Dr. Ennever. Also, advised them to bring all medication bottles and insurance card information. ° °

## 2013-12-09 ENCOUNTER — Other Ambulatory Visit (HOSPITAL_BASED_OUTPATIENT_CLINIC_OR_DEPARTMENT_OTHER): Payer: BC Managed Care – PPO | Admitting: Lab

## 2013-12-09 ENCOUNTER — Ambulatory Visit (HOSPITAL_BASED_OUTPATIENT_CLINIC_OR_DEPARTMENT_OTHER): Payer: BC Managed Care – PPO | Admitting: Hematology & Oncology

## 2013-12-09 ENCOUNTER — Ambulatory Visit (HOSPITAL_BASED_OUTPATIENT_CLINIC_OR_DEPARTMENT_OTHER): Payer: BC Managed Care – PPO

## 2013-12-09 ENCOUNTER — Ambulatory Visit: Payer: BC Managed Care – PPO

## 2013-12-09 VITALS — BP 113/67 | HR 88 | Temp 98.2°F | Resp 14 | Ht 63.0 in | Wt 195.0 lb

## 2013-12-09 DIAGNOSIS — D72829 Elevated white blood cell count, unspecified: Secondary | ICD-10-CM

## 2013-12-09 DIAGNOSIS — D580 Hereditary spherocytosis: Secondary | ICD-10-CM

## 2013-12-09 DIAGNOSIS — D473 Essential (hemorrhagic) thrombocythemia: Secondary | ICD-10-CM

## 2013-12-09 DIAGNOSIS — Z23 Encounter for immunization: Secondary | ICD-10-CM

## 2013-12-09 LAB — CBC WITH DIFFERENTIAL (CANCER CENTER ONLY)
BASO#: 0.1 10*3/uL (ref 0.0–0.2)
BASO%: 0.5 % (ref 0.0–2.0)
EOS ABS: 0.2 10*3/uL (ref 0.0–0.5)
EOS%: 1.3 % (ref 0.0–7.0)
HCT: 40.9 % (ref 34.8–46.6)
HEMOGLOBIN: 14.3 g/dL (ref 11.6–15.9)
LYMPH#: 4.8 10*3/uL — ABNORMAL HIGH (ref 0.9–3.3)
LYMPH%: 38 % (ref 14.0–48.0)
MCH: 28.8 pg (ref 26.0–34.0)
MCHC: 35 g/dL (ref 32.0–36.0)
MCV: 82 fL (ref 81–101)
MONO#: 1.4 10*3/uL — AB (ref 0.1–0.9)
MONO%: 10.9 % (ref 0.0–13.0)
NEUT#: 6.2 10*3/uL (ref 1.5–6.5)
NEUT%: 49.3 % (ref 39.6–80.0)
Platelets: 713 10*3/uL — ABNORMAL HIGH (ref 145–400)
RBC: 4.97 10*6/uL (ref 3.70–5.32)
RDW: 13.4 % (ref 11.1–15.7)
WBC: 12.5 10*3/uL — ABNORMAL HIGH (ref 3.9–10.0)

## 2013-12-09 LAB — IRON AND TIBC CHCC
%SAT: 29 % (ref 21–57)
Iron: 91 ug/dL (ref 41–142)
TIBC: 314 ug/dL (ref 236–444)
UIBC: 222 ug/dL (ref 120–384)

## 2013-12-09 LAB — FERRITIN CHCC: Ferritin: 62 ng/ml (ref 9–269)

## 2013-12-09 LAB — CHCC SATELLITE - SMEAR

## 2013-12-09 LAB — TECHNOLOGIST REVIEW CHCC SATELLITE

## 2013-12-09 MED ORDER — INFLUENZA VAC SPLIT QUAD 0.5 ML IM SUSY
0.5000 mL | PREFILLED_SYRINGE | Freq: Once | INTRAMUSCULAR | Status: AC
  Administered 2013-12-09: 0.5 mL via INTRAMUSCULAR
  Filled 2013-12-09: qty 0.5

## 2013-12-09 MED ORDER — PNEUMOCOCCAL VAC POLYVALENT 25 MCG/0.5ML IJ INJ
0.5000 mL | INJECTION | INTRAMUSCULAR | Status: AC
  Administered 2013-12-09: 0.5 mL via INTRAMUSCULAR
  Filled 2013-12-09: qty 0.5

## 2013-12-09 NOTE — Progress Notes (Signed)
Referral MD  Reason for Referral: thrombocytosis and leukocytosis                                   History of splenectomy for hereditary spherocytosis  Chief Complaint  Patient presents with  . NEW PATIENT  : I'm here to have my blood looked at  HPI: Amber Yu Is a very charming 33 year old white female. She is originally from [place]. She has a family history of hereditary spherocytosis. Her father and sisters have it. She actually had her spleen out when she was 33 years old.  She sees Dr. Sabra Heck of OB/GYN. She has some lab work done. She was having some abdominal pain and think had an ovarian cyst. She was found to have a white cell count of 12.3. Hemoglobin 14.4.her platelet count was 806. She had a normal white cell differential. This was back in July.  In September of this her, her white cell count was 10.6. Hemoglobin 14.  Her platelet count was 787. Her MCV was 82. She had a normal white cell differential.  It was felt that she needed a hematologic evaluation.  She has had no problems with infections. She is working as a Geophysicist/field seismologist. She recent had a herniated disc in her neck. She did not have surgery for. She did have some epidural injections. These were done up in [place] where her family lives.  She's had no change in bowel or bladder habits. She's had no leg swelling. She's had no rashes.  Her appetite has been good. She's had no nausea or vomiting. She has had no cough. There's been no shortness of breath.  Her last pneumonia vaccine was about 5 years ago. I told her she really needs one every 3 years. She does get her flu shot.  She is not a vegetarian. She has not noted any problems with bleeding. Her monthly cycles are pretty normal.  She has had no surgery outside of ankle surgery.  She does not smoke. She has a glass of wine once in a while.     Past Medical History  Diagnosis Date  . Hypotension, postural     Pt. placed on Zoloft for this problem - off  currently  . Spherocytosis, hereditary 1985    family members also have, father and 2 sisters and 2 nieces  . Herniated disc, cervical 07/2012    treated with 4 epidural injections  :  Past Surgical History  Procedure Laterality Date  . 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 Ankle fracture surgery Left 1999  :  Current outpatient prescriptions: ibuprofen (ADVIL) 200 MG tablet, Take 400 mg by mouth every 6 (six) hours as needed., Disp: , Rfl: :  :  Allergies  Allergen Reactions  . Minocycline Other (See Comments)    dizziness  . Latex Rash  :  Family History  Problem Relation Age of Onset  . Heart disease Mother   . Other Sister     spherocytosis  . Other Sister     spherocytosis  :  History   Social History  . Marital Status: Married    Spouse Name: N/A    Number of Children: 0  . Years of Education: N/A   Occupational History  .     Social History Main Topics  . Smoking status: Never Smoker   . Smokeless tobacco: Never Used  .  Alcohol Use: 0.5 oz/week    1 drink(s) per week     Comment: once per month  . Drug Use: No  . Sexual Activity:    Partners: Male    Birth Control/ Protection: Pill   Other Topics Concern  . Not on file   Social History Narrative  . No narrative on file  :  Pertinent items are noted in HPI.  Exam: @IPVITALS @ Well-developed and well-nourished white female in no obvious distress. Vital signs show a temperature of 98.2. Pulse 88. Blood pressure 113/67. Weight is 195 pounds. Head and neck exam shows no ocular or oral lesions. There are no palpable cervical or supraclavicular lymph nodes. Lungs are clear. Cardiac exam regular rate and rhythm with no murmurs, rubs or bruits. Axillary exam shows no bilateral axillary adenopathy. Abdomen is soft. She has a well-healed slightly scar. There is no fluid wave. There is no palpable abdominal mass. There is no palpable hepatomegaly. Back exam shows no tenderness over the  spine, ribs or hips. Extremity shows no clubbing, cyanosis or edema. She has good range of motion of her joints. She has good strength. Neurological exam is nonfocal. Skin exam shows no rashes, ecchymoses or petechia.   Recent Labs  12/09/13 1221  WBC 12.5*  HGB 14.3  HCT 40.9  PLT 713*    Recent Labs  12/09/13 1221  NA 141  K 3.8  CL 103  CO2 22  GLUCOSE 85  BUN 9  CREATININE 0.80  CALCIUM 9.8    Blood smear review: normochromic and normocytic publisher red blood cells. She has a few [employer] bodies. She has no nucleated red cells. I see no schistocytes. She has some spherocytes. There is no rouleau formation. White cells. Normal in morphology maturation. She has good maturation of her white blood cells. I see no immature myeloid or lymphoid forms. She has no atypical lymphocytes.  Platelets areincreased in number. She has several large platelets. Platelets are well granulated.  Pathology:none    Assessment and Plan: Amber Yu is a very nice 33 year old white female. She had a history of hereditary spherocytosis. She's had her spleen out.  I have to believe that the leukocytosis and thrombocytosis is all reflective of her splenectomy. I looked at her blood under the microscope and I certainly do not see anything that looked malignant or any evidence of bone marrow hyperactivity.  I suppose that she may have some iron deficiency anemia. Again she is not anemic but her iron levels might be on the low side. If so, this certainly would cause her platelets to go up.  Just to be cautious, I did send off the typical genetic mutation studies for a myeloproliferative neoplasm.this would be highly unusual. I do know that there is an increased risk of leukemia in patients whose spleens have been removed but again, I see no evidence of this.  I did tell her to take folic acid. She needs to take 1 mg of folic acid a day.  She did get her pneumonia vaccine in the office.  At  this point, I really don't think we have to get her back to be seen. This would change however if we do find that she does have a genetic mutation analysis. If she does, then I probably would have to do a bone marrow test on her.  I spent a good 45 minutes with she and her husband.  They are fine to talk to.

## 2013-12-09 NOTE — Patient Instructions (Signed)
Pneumococcal Vaccine, Polyvalent solution for injection What is this medicine? PNEUMOCOCCAL VACCINE, POLYVALENT (NEU mo KOK al vak SEEN, pol ee VEY luhnt) is a vaccine to prevent pneumococcus bacteria infection. These bacteria are a major cause of ear infections, Strep throat infections, and serious pneumonia, meningitis, or blood infections worldwide. These vaccines help the body to produce antibodies (protective substances) that help your body defend against these bacteria. This vaccine is recommended for people 61 years of age and older with health problems. It is also recommended for all adults over 73 years old. This vaccine will not treat an infection. This medicine may be used for other purposes; ask your health care provider or pharmacist if you have questions. COMMON BRAND NAME(S): Pneumovax 23 What should I tell my health care provider before I take this medicine? They need to know if you have any of these conditions: -bleeding problems -bone marrow or organ transplant -cancer, Hodgkin's disease -fever -infection -immune system problems -low platelet count in the blood -seizures -an unusual or allergic reaction to pneumococcal vaccine, diphtheria toxoid, other vaccines, latex, other medicines, foods, dyes, or preservatives -pregnant or trying to get pregnant -breast-feeding How should I use this medicine? This vaccine is for injection into a muscle or under the skin. It is given by a health care professional. A copy of Vaccine Information Statements will be given before each vaccination. Read this sheet carefully each time. The sheet may change frequently. Talk to your pediatrician regarding the use of this medicine in children. While this drug may be prescribed for children as young as 63 years of age for selected conditions, precautions do apply. Overdosage: If you think you have taken too much of this medicine contact a poison control center or emergency room at once. NOTE: This  medicine is only for you. Do not share this medicine with others. What if I miss a dose? It is important not to miss your dose. Call your doctor or health care professional if you are unable to keep an appointment. What may interact with this medicine? -medicines for cancer chemotherapy -medicines that suppress your immune function -medicines that treat or prevent blood clots like warfarin, enoxaparin, and dalteparin -steroid medicines like prednisone or cortisone This list may not describe all possible interactions. Give your health care provider a list of all the medicines, herbs, non-prescription drugs, or dietary supplements you use. Also tell them if you smoke, drink alcohol, or use illegal drugs. Some items may interact with your medicine. What should I watch for while using this medicine? Mild fever and pain should go away in 3 days or less. Report any unusual symptoms to your doctor or health care professional. What side effects may I notice from receiving this medicine? Side effects that you should report to your doctor or health care professional as soon as possible: -allergic reactions like skin rash, itching or hives, swelling of the face, lips, or tongue -breathing problems -confused -fever over 102 degrees F -pain, tingling, numbness in the hands or feet -seizures -unusual bleeding or bruising -unusual muscle weakness Side effects that usually do not require medical attention (report to your doctor or health care professional if they continue or are bothersome): -aches and pains -diarrhea -fever of 102 degrees F or less -headache -irritable -loss of appetite -pain, tender at site where injected -trouble sleeping This list may not describe all possible side effects. Call your doctor for medical advice about side effects. You may report side effects to FDA at 1-800-FDA-1088. Where should  I keep my medicine? This does not apply. This vaccine is given in a clinic, pharmacy,  doctor's office, or other health care setting and will not be stored at home. NOTE: This sheet is a summary. It may not cover all possible information. If you have questions about this medicine, talk to your doctor, pharmacist, or health care provider.  2015, Elsevier/Gold Standard. (2007-08-31 14:32:37) Influenza Virus Vaccine injection What is this medicine? INFLUENZA VIRUS VACCINE (in floo EN zuh VAHY ruhs vak SEEN) helps to reduce the risk of getting influenza also known as the flu. The vaccine only helps protect you against some strains of the flu. This medicine may be used for other purposes; ask your health care provider or pharmacist if you have questions. COMMON BRAND NAME(S): Afluria, Agriflu, Fluarix, Fluarix Quadrivalent, FLUCELVAX, Flulaval, Fluvirin, Fluzone, Fluzone High-Dose, Fluzone Intradermal What should I tell my health care provider before I take this medicine? They need to know if you have any of these conditions: -bleeding disorder like hemophilia -fever or infection -Guillain-Barre syndrome or other neurological problems -immune system problems -infection with the human immunodeficiency virus (HIV) or AIDS -low blood platelet counts -multiple sclerosis -an unusual or allergic reaction to influenza virus vaccine, latex, other medicines, foods, dyes, or preservatives. Different brands of vaccines contain different allergens. Some may contain latex or eggs. Talk to your doctor about your allergies to make sure that you get the right vaccine. -pregnant or trying to get pregnant -breast-feeding How should I use this medicine? This vaccine is for injection into a muscle or under the skin. It is given by a health care professional. A copy of Vaccine Information Statements will be given before each vaccination. Read this sheet carefully each time. The sheet may change frequently. Talk to your healthcare provider to see which vaccines are right for you. Some vaccines should not  be used in all age groups. Overdosage: If you think you have taken too much of this medicine contact a poison control center or emergency room at once. NOTE: This medicine is only for you. Do not share this medicine with others. What if I miss a dose? This does not apply. What may interact with this medicine? -chemotherapy or radiation therapy -medicines that lower your immune system like etanercept, anakinra, infliximab, and adalimumab -medicines that treat or prevent blood clots like warfarin -phenytoin -steroid medicines like prednisone or cortisone -theophylline -vaccines This list may not describe all possible interactions. Give your health care provider a list of all the medicines, herbs, non-prescription drugs, or dietary supplements you use. Also tell them if you smoke, drink alcohol, or use illegal drugs. Some items may interact with your medicine. What should I watch for while using this medicine? Report any side effects that do not go away within 3 days to your doctor or health care professional. Call your health care provider if any unusual symptoms occur within 6 weeks of receiving this vaccine. You may still catch the flu, but the illness is not usually as bad. You cannot get the flu from the vaccine. The vaccine will not protect against colds or other illnesses that may cause fever. The vaccine is needed every year. What side effects may I notice from receiving this medicine? Side effects that you should report to your doctor or health care professional as soon as possible: -allergic reactions like skin rash, itching or hives, swelling of the face, lips, or tongue Side effects that usually do not require medical attention (report to your doctor or  health care professional if they continue or are bothersome): -fever -headache -muscle aches and pains -pain, tenderness, redness, or swelling at the injection site -tiredness This list may not describe all possible side effects.  Call your doctor for medical advice about side effects. You may report side effects to FDA at 1-800-FDA-1088. Where should I keep my medicine? The vaccine will be given by a health care professional in a clinic, pharmacy, doctor's office, or other health care setting. You will not be given vaccine doses to store at home. NOTE: This sheet is a summary. It may not cover all possible information. If you have questions about this medicine, talk to your doctor, pharmacist, or health care provider.  2015, Elsevier/Gold Standard. (2011-08-04 58:09:98)

## 2013-12-10 ENCOUNTER — Telehealth: Payer: Self-pay

## 2013-12-10 ENCOUNTER — Telehealth: Payer: Self-pay | Admitting: *Deleted

## 2013-12-10 DIAGNOSIS — R109 Unspecified abdominal pain: Secondary | ICD-10-CM

## 2013-12-10 NOTE — Telephone Encounter (Signed)
Dr.Miller, first available appointment to see you will be on Thursday during an ultrasound time slot. Would you like me to schedule an office visit during this time to see you for evaluation or schedule patient for an ultrasound and consult with you that day?

## 2013-12-10 NOTE — Telephone Encounter (Signed)
Spoke with patient. Patient is agreeable to schedule PUS at this time. Appointment scheduled for Thursday 11/5 at 12:30pm with 1:15pm consult with Dr.Miller. Patient is agreeable to date and time. Patient will call back if symptoms worsen to be seen sooner for evaluation or will be seen for care overnight if needed. Order for PUS entered. Will need precert.  Cc: Felipa Emory for precert  Routing to provider for final review. Patient agreeable to disposition. Will close encounter

## 2013-12-10 NOTE — Telephone Encounter (Signed)
Spoke with patient at time of incoming call. Patient states that she has been having a "dull aching pain over my ovaries for a week or two." States that the pain is intermittent and can be only on the right side or both sides. "It is more uncomfortable than anything. I have had a cyst before so I thought I should have it checked out." Patient is not in current pain.Patient would like to come in to see Dr.Miller. Advised patient will need to speak with provider and return call with appointment date and time options. Patient is agreeable.

## 2013-12-10 NOTE — Telephone Encounter (Signed)
OK to schedule u/s on Thurs if possible.

## 2013-12-10 NOTE — Telephone Encounter (Addendum)
-----   Message from [name], MD sent at 12/09/2013  6:32 PM EST ----- Call and let her know that her iron level is okay. Amber Yu -Informed pt that iron level is good. Pt verbalized understanding.

## 2013-12-12 ENCOUNTER — Ambulatory Visit (INDEPENDENT_AMBULATORY_CARE_PROVIDER_SITE_OTHER): Payer: BC Managed Care – PPO

## 2013-12-12 ENCOUNTER — Ambulatory Visit (INDEPENDENT_AMBULATORY_CARE_PROVIDER_SITE_OTHER): Payer: BC Managed Care – PPO | Admitting: Obstetrics & Gynecology

## 2013-12-12 VITALS — BP 116/72 | Ht 63.0 in | Wt 192.0 lb

## 2013-12-12 DIAGNOSIS — N83202 Unspecified ovarian cyst, left side: Secondary | ICD-10-CM

## 2013-12-12 DIAGNOSIS — R109 Unspecified abdominal pain: Secondary | ICD-10-CM

## 2013-12-12 DIAGNOSIS — N8320 Unspecified ovarian cysts: Secondary | ICD-10-CM

## 2013-12-12 LAB — COMPREHENSIVE METABOLIC PANEL
ALBUMIN: 4.6 g/dL (ref 3.5–5.2)
ALT: 13 U/L (ref 0–35)
AST: 18 U/L (ref 0–37)
Alkaline Phosphatase: 48 U/L (ref 39–117)
BUN: 9 mg/dL (ref 6–23)
CALCIUM: 9.8 mg/dL (ref 8.4–10.5)
CHLORIDE: 103 meq/L (ref 96–112)
CO2: 22 mEq/L (ref 19–32)
Creatinine, Ser: 0.8 mg/dL (ref 0.50–1.10)
Glucose, Bld: 85 mg/dL (ref 70–99)
Potassium: 3.8 mEq/L (ref 3.5–5.3)
Sodium: 141 mEq/L (ref 135–145)
Total Bilirubin: 0.9 mg/dL (ref 0.2–1.2)
Total Protein: 7.3 g/dL (ref 6.0–8.3)

## 2013-12-12 LAB — RETICULOCYTES (CHCC)
ABS Retic: 79.4 10*3/uL (ref 19.0–186.0)
RBC.: 4.96 MIL/uL (ref 3.87–5.11)
Retic Ct Pct: 1.6 % (ref 0.4–2.3)

## 2013-12-12 LAB — VON WILLEBRAND PANEL
COAGULATION FACTOR VIII: 102 % (ref 73–140)
Ristocetin Co-factor, Plasma: 76 % (ref 42–200)
VON WILLEBRAND ANTIGEN, PLASMA: 85 % (ref 50–217)

## 2013-12-12 LAB — LACTATE DEHYDROGENASE: LDH: 206 U/L (ref 94–250)

## 2013-12-12 LAB — HAPTOGLOBIN: Haptoglobin: 56 mg/dL (ref 45–215)

## 2013-12-12 NOTE — Telephone Encounter (Signed)
D/W pt recommendations at appt 12/12/13.  See note from visit.  Encounter closed.

## 2013-12-12 NOTE — Progress Notes (Signed)
33 y.o. G0 MWF here for discussion of recent pain issues.  Pt had an ovarian cyst earlier this in the year.  She was placed on OCPs for this and the cyst resolved.  Pain has returned and she is here for PUS to see if she has another cyst.    She and spouse are considering pregnancy.  They have been trying for a short while.  Day 3 FSH was 9/15 was 10.9.  Pt and spouse have decided that they want to wait to try again for pregnancy until after pt's sister gets married next summer.  Actually, she has decided she wants to wait but wants to let me know.    Sexually active:  yes  Contraception: no method  FINDINGS: UTERUS: 7.5 x 4.4 x 3.4cm EMS: 6.49mm ADNEXA:   Left ovary 4.1 x 2.1 x 2.2cm with 3.2cm cyst with thin septation, avascular, echofree.  Possible paraovarian cyst (exactly the same as last u/s 10/03/13)   Right ovary 3.2 x 3.2 x 1.8cm with multiple peripheral follicles CUL DE SAC: no free fluid  Findings discussed with pt.  Day 3 FSH is borderline elevated which doesn't mean pt can't get pregnant but should be intentional about decisions.  She really wants to be able to go and be a part of sister's wedding.  Asks if she should restart OCPs.  I do not feel this is what she should due to ovarian suppresion that could occur.  Condom use encourage.  Also, recommended repeat Day 3 FSH in 3-6 months.  If rapidly changing, would make different recommendations at that time.  Assessment:  Left ovarian cyst, possible paraovarian Mildly elevated Day 3 FSH Plan: Repeat Day 3 FSH around AEX 4/16.  If increased further, would recommend pt see Dr. Kerin Perna.  She is in agreement with this.  No OCPs for now.  Pt will use condoms.  ~15 minutes spent with patient >50% of time was in face to face discussion of above.

## 2013-12-16 ENCOUNTER — Encounter: Payer: Self-pay | Admitting: Hematology & Oncology

## 2013-12-18 ENCOUNTER — Encounter: Payer: Self-pay | Admitting: Hematology & Oncology

## 2013-12-28 ENCOUNTER — Encounter: Payer: Self-pay | Admitting: Obstetrics & Gynecology

## 2014-02-21 ENCOUNTER — Telehealth: Payer: Self-pay | Admitting: Nurse Practitioner

## 2014-02-21 NOTE — Telephone Encounter (Signed)
Spoke with patient. Advised per last OV note with Dr.Miller patient was to have Day 3 [place] (see note below). Patient is not currently on cycle. "She told me I needed to have my progesterone level checked first and that will be day 21 like she told me." Advised patient will send a message over to [place] for orders and return call with any further recommendations. Patient is agreeable.   Assessment: Left ovarian cyst, possible paraovarian Mildly elevated Day 3 FSH Plan: Repeat Day 3 FSH around AEX 4/16. If increased further, would recommend pt see Dr. Kerin Perna. She is in agreement with this. No OCPs for now. Pt will use condoms.

## 2014-02-21 NOTE — Telephone Encounter (Signed)
Spoke with patient. Advised patient of message from [place]. Patient is agreeable and will call with first day of next menses to schedule appointment. Lab appointment for 1/19 cancelled.  Routing to provider for final review. Patient agreeable to disposition. Will close encounter

## 2014-02-21 NOTE — Telephone Encounter (Signed)
Patient was told to make an appointment for labs. Patient's next appointment 02/25/14, need orders. Patient last seen 12/12/13.

## 2014-02-21 NOTE — Telephone Encounter (Signed)
I want to check her Day 3 FSH again.  She needs to call with onset of cycle for this.

## 2014-02-25 ENCOUNTER — Other Ambulatory Visit: Payer: Self-pay

## 2014-05-07 ENCOUNTER — Telehealth: Payer: Self-pay | Admitting: Nurse Practitioner

## 2014-05-07 DIAGNOSIS — R7989 Other specified abnormal findings of blood chemistry: Secondary | ICD-10-CM

## 2014-05-07 NOTE — Telephone Encounter (Signed)
Patient states she needs to come in for some bloodwork not sure exactly what blood work she needs done.

## 2014-05-07 NOTE — Telephone Encounter (Signed)
Spoke with patient. Patient states that she started her cycle today and would like to schedule lab appointment. Appointment scheduled for day 3 on 4/1 at 9:30am. Patient is agreeable to date and time. Order placed for [facility].  Routing to provider for final review. Patient agreeable to disposition. Will close encounter

## 2014-05-07 NOTE — Telephone Encounter (Signed)
Telephone encounter from 02/21/2014 seen below with recommendations from [place].  Lyman Speller, MD at 02/21/2014 10:30 AM     Status: Signed       Expand All Collapse All   I want to check her Day 3 FSH again. She needs to call with onset of cycle for this.            Amber Awe, RN at 02/21/2014 10:14 AM     Status: Signed       Expand All Collapse All   Spoke with patient. Advised per last OV note with Dr.Miller patient was to have Day 3 [employer] (see note below). Patient is not currently on cycle. "She told me I needed to have my progesterone level checked first and that will be day 21 like she told me." Advised patient will send a message over to [place] for orders and return call with any further recommendations. Patient is agreeable.   Assessment: Left ovarian cyst, possible paraovarian Mildly elevated Day 3 FSH Plan: Repeat Day 3 FSH around AEX 4/16. If increased further, would recommend pt see Dr. Kerin Perna. She is in agreement with this. No OCPs for now. Pt will use condoms.

## 2014-05-09 ENCOUNTER — Other Ambulatory Visit (INDEPENDENT_AMBULATORY_CARE_PROVIDER_SITE_OTHER): Payer: BLUE CROSS/BLUE SHIELD

## 2014-05-09 DIAGNOSIS — R7989 Other specified abnormal findings of blood chemistry: Secondary | ICD-10-CM

## 2014-05-10 LAB — FOLLICLE STIMULATING HORMONE: FSH: 11.2 m[IU]/mL

## 2014-05-14 ENCOUNTER — Telehealth: Payer: Self-pay | Admitting: Obstetrics & Gynecology

## 2014-05-14 ENCOUNTER — Telehealth: Payer: Self-pay

## 2014-05-14 NOTE — Telephone Encounter (Signed)
-----   Message from Megan Salon, MD sent at 05/14/2014 10:42 AM EDT ----- Please inform pt her day 3 FSH is a tiny bit higher (10.9 to 11.2) than it was.  She and her husband were interested last year in pursuing pregnancy and then postponed due to upcoming travel opportunities.  I think she should see Dr. Kerin Perna for a plan when she is ready to start trying.  She will not be surprised by this as we discussed at her last visit.  I want to optimize her time when she is ready to start.  Please make appt for her.  Thanks.

## 2014-05-14 NOTE — Telephone Encounter (Signed)
Patient calling requesting to speak with the nurse

## 2014-05-14 NOTE — Telephone Encounter (Signed)
Spoke with patient. Advised patient of message as seen below from [place]. Patient is agreeable and verbalizes understanding. Patient states she is not yet ready to try for pregnancy and would like to wait to schedule appointment with Dr.Yalcinkaya. Will call back when she would like appointment.  Routing to provider for final review. Patient agreeable to disposition. Will close encounter

## 2014-05-20 ENCOUNTER — Ambulatory Visit (INDEPENDENT_AMBULATORY_CARE_PROVIDER_SITE_OTHER): Payer: BLUE CROSS/BLUE SHIELD | Admitting: Nurse Practitioner

## 2014-05-20 ENCOUNTER — Encounter: Payer: Self-pay | Admitting: Nurse Practitioner

## 2014-05-20 VITALS — BP 124/76 | HR 64 | Ht 63.5 in | Wt 181.0 lb

## 2014-05-20 DIAGNOSIS — D75839 Thrombocytosis, unspecified: Secondary | ICD-10-CM

## 2014-05-20 DIAGNOSIS — R8290 Unspecified abnormal findings in urine: Secondary | ICD-10-CM

## 2014-05-20 DIAGNOSIS — D72829 Elevated white blood cell count, unspecified: Secondary | ICD-10-CM

## 2014-05-20 DIAGNOSIS — Z01419 Encounter for gynecological examination (general) (routine) without abnormal findings: Secondary | ICD-10-CM | POA: Diagnosis not present

## 2014-05-20 DIAGNOSIS — D473 Essential (hemorrhagic) thrombocythemia: Secondary | ICD-10-CM | POA: Diagnosis not present

## 2014-05-20 MED ORDER — IBUPROFEN 800 MG PO TABS: 800.0000 mg | ORAL_TABLET | Freq: Three times a day (TID) | ORAL | Status: DC

## 2014-05-20 NOTE — Progress Notes (Signed)
Patient ID: Amber Yu, female   DOB: 25-May-1980, 34 y.o.   MRN: [id number] 33 y.o. G0P0 Married  Caucasian Fe here for annual exam.  Off OCP since 09/2013.  Since then menses in about every 30 days.  Menses lasting 5 days.   Now using condoms for birth control.,  After sister's wedding in October may try again afterwards for a pregnancy.  Menses is moderate to light .  Still cramps X 2 days.  Recent 4 days ago a repeat [place] which was at 11.2.  Patient's last menstrual period was 05/07/2014 (exact date).          Sexually active: Yes.    The current method of family planning is condoms all of the time.    Exercising: Yes.    cardio and strength training up to 5 days per week Smoker:  no  Health Maintenance: Pap:  05/10/12, negative with neg HR HPV TDaP:  05/16/13 Gardasil:  Completed in 2007 Labs:  HB:  @ [place]  Urine:  Trace leuk's   reports that she has never smoked. She has never used smokeless tobacco. She reports that she drinks about 0.5 oz of alcohol per week. She reports that she does not use illicit drugs.  Past Medical History  Diagnosis Date  . Hypotension, postural     Pt. placed on Zoloft for this problem - off currently  . Spherocytosis, hereditary 1985    family members also have, father and 2 sisters and 2 nieces  . Herniated disc, cervical 07/2012    treated with 4 epidural injections    Past Surgical History  Procedure Laterality Date  . 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 Ankle fracture surgery Left 1999    Current Outpatient Prescriptions  Medication Sig Dispense Refill  . ibuprofen (ADVIL,MOTRIN) 800 MG tablet Take 1 tablet (800 mg total) by mouth every 8 (eight) hours. 30 tablet 6   No current facility-administered medications for this visit.    Family History  Problem Relation Age of Onset  . Heart disease Mother   . Other Sister     spherocytosis  . Other Sister     spherocytosis    ROS:  Pertinent items are noted in  HPI.  Otherwise, a comprehensive ROS was negative.  Exam:   BP 124/76 mmHg  Pulse 64  Ht 5' 3.5" (1.613 m)  Wt 181 lb (82.101 kg)  BMI 31.56 kg/m2  LMP 05/07/2014 (Exact Date) Height: 5' 3.5" (161.3 cm) Ht Readings from Last 3 Encounters:  05/20/14 5' 3.5" (1.613 m)  12/12/13 5\' 3"  (1.6 m)  12/09/13 5\' 3"  (1.6 m)    General appearance: alert, cooperative and appears stated age Head: Normocephalic, without obvious abnormality, atraumatic Neck: no adenopathy, supple, symmetrical, trachea midline and thyroid normal to inspection and palpation Lungs: clear to auscultation bilaterally Breasts: normal appearance, no masses or tenderness Heart: regular rate and rhythm Abdomen: soft, non-tender; no masses,  no organomegaly Extremities: extremities normal, atraumatic, no cyanosis or edema Skin: SN color, texture, turgor normal. No rashes or lesions Lymph nodes: Cervical, supraclavicular, and axillary nodes normal. No abnormal inguinal nodes palpated Neurologic: Grossly normal   Pelvic: External genitalia:  no lesions              Urethra:  normal appearing urethra with no masses, tenderness or lesions              Bartholin's and Skene's: normal  Vagina: normal appearing vagina with normal color and discharge, no lesions              Cervix: anteverted              Pap taken: No. Bimanual Exam:  Uterus:  normal size, contour, position, consistency, mobility, non-tender              Adnexa: no mass, fullness, tenderness               Rectovaginal: Confirms               Anus:  normal sphincter tone, no lesions  Chaperone present:  no  A:  Well Woman with normal exam  Contraception with condoms Maybe considering pregnancy this fall  History of OV cyst last summer off OCP  History of thrombocytosis and leukocytosis with S/P splenectomy for hereditary spherocytosis   P:   Reviewed health and wellness pertinent to exam  Pap smear not taken today  Follow  with urine culture and micro  Continue with prenatal MVI  Counseled on breast self exam, adequate intake of calcium and vitamin D, diet and exercise return annually or prn  An After Visit Summary was printed and given to the patient.

## 2014-05-20 NOTE — Patient Instructions (Signed)

## 2014-05-21 LAB — URINALYSIS, MICROSCOPIC ONLY
Casts: NONE SEEN
Crystals: NONE SEEN
SQUAMOUS EPITHELIAL / LPF: NONE SEEN

## 2014-05-21 LAB — URINE CULTURE
COLONY COUNT: NO GROWTH
Organism ID, Bacteria: NO GROWTH

## 2014-05-22 ENCOUNTER — Telehealth: Payer: Self-pay | Admitting: Obstetrics & Gynecology

## 2014-05-22 DIAGNOSIS — R7989 Other specified abnormal findings of blood chemistry: Secondary | ICD-10-CM

## 2014-05-22 NOTE — Telephone Encounter (Signed)
Spoke with patient. Patient requesting referral to Dr.Yalcinkaya at this time. Advised will place referral and she will be contacted regarding appointment date and time via referrals coordinator in office or [place] office directly. Patient is agreeable. Please see note below from [place] regarding referral. Patient also given recent lab results as seen below from visit on 05/20/2014. Patient is agreeable and denies any current symptoms.  Notes Recorded by Regina Eck, CNM on 05/22/2014 at 8:40 AM Notify patient that urine micro showed bacteria only, culture negative, no indication for treatment Patient status   Notes Recorded by Jasmine Awe, RN on 05/14/2014 at 11:31 AM Spoke with patient. Results given. Please see telephone encounter. Notes Recorded by Megan Salon, MD on 05/14/2014 at 10:42 AM Please inform pt her day 3 FSH is a tiny bit higher (10.9 to 11.2) than it was. She and her husband were interested last year in pursuing pregnancy and then postponed due to upcoming travel opportunities. I think she should see Dr. Kerin Perna for a plan when she is ready to start trying. She will not be surprised by this as we discussed at her last visit. I want to optimize her time when she is ready to start. Please make appt for her. Thanks.   Routing to provider for final review. Patient agreeable to disposition. Will close encounter

## 2014-05-22 NOTE — Telephone Encounter (Signed)
Patient says she has been talking with Verline Lema, no further details given. No open tc note.

## 2014-05-25 NOTE — Progress Notes (Signed)
Reviewed personally.  M. Suzanne Latishia Suitt, MD.  

## 2014-05-28 NOTE — Telephone Encounter (Signed)
Patient calling requesting to speak with nurse about "pain."

## 2014-05-28 NOTE — Telephone Encounter (Signed)
I agree with plan for evaluation tomorrow.  If increased pain, fever, or nausea/vomiting, to the emergency department tonight.

## 2014-05-28 NOTE — Telephone Encounter (Signed)
Spoke with patient. She was seen last week for annual exam with Milford Cage, FNP. Had urine with bacteria but negative culture. She states she has had aching in pelvic area "all over, but mostly on the right side, and it feels like menstrual cramping." Patient states it started one week ago and has had an increased over the week. LMP 05/07/14 uses condoms only for birth control. Patient has a hx of ovarian cysts. Also had yeast about one month ago and self treated with monistat otc and and feels this may be related.  Patient is advised to take a home pregnancy test and to return call to our office if positive and she is scheduled for office visit with Milford Cage, [place] for 05/29/14 at 0915. Advised patient to call back or seek immediate medical care if develops increased pain, vaginal bleeding, fevers or abdominal pain.   Routing to Dr. Quincy Simmonds to review as covering.

## 2014-05-29 ENCOUNTER — Telehealth: Payer: Self-pay | Admitting: Nurse Practitioner

## 2014-05-29 ENCOUNTER — Encounter: Payer: Self-pay | Admitting: Nurse Practitioner

## 2014-05-29 ENCOUNTER — Ambulatory Visit (INDEPENDENT_AMBULATORY_CARE_PROVIDER_SITE_OTHER): Payer: BLUE CROSS/BLUE SHIELD | Admitting: Nurse Practitioner

## 2014-05-29 VITALS — BP 118/82 | Temp 98.0°F | Resp 16 | Ht 63.5 in | Wt 182.0 lb

## 2014-05-29 DIAGNOSIS — R102 Pelvic and perineal pain: Secondary | ICD-10-CM

## 2014-05-29 LAB — POCT URINE PREGNANCY: Preg Test, Ur: NEGATIVE

## 2014-05-29 LAB — POCT URINALYSIS DIPSTICK
Leukocytes, UA: NEGATIVE
Urobilinogen, UA: NEGATIVE
pH, UA: 5

## 2014-05-29 LAB — CBC WITH DIFFERENTIAL/PLATELET
BASOS ABS: 0.1 10*3/uL (ref 0.0–0.1)
Basophils Relative: 1 % (ref 0–1)
EOS ABS: 0.2 10*3/uL (ref 0.0–0.7)
Eosinophils Relative: 2 % (ref 0–5)
HEMATOCRIT: 41.1 % (ref 36.0–46.0)
HEMOGLOBIN: 14.6 g/dL (ref 12.0–15.0)
Lymphocytes Relative: 45 % (ref 12–46)
Lymphs Abs: 3.8 10*3/uL (ref 0.7–4.0)
MCH: 29.4 pg (ref 26.0–34.0)
MCHC: 35.5 g/dL (ref 30.0–36.0)
MCV: 82.7 fL (ref 78.0–100.0)
MPV: 8.8 fL (ref 8.6–12.4)
Monocytes Absolute: 0.9 10*3/uL (ref 0.1–1.0)
Monocytes Relative: 11 % (ref 3–12)
NEUTROS ABS: 3.4 10*3/uL (ref 1.7–7.7)
Neutrophils Relative %: 41 % — ABNORMAL LOW (ref 43–77)
Platelets: 685 10*3/uL — ABNORMAL HIGH (ref 150–400)
RBC: 4.97 MIL/uL (ref 3.87–5.11)
RDW: 13.8 % (ref 11.5–15.5)
WBC: 8.4 10*3/uL (ref 4.0–10.5)

## 2014-05-29 NOTE — Telephone Encounter (Signed)
Discussed the Stat CBC with patient and WBC is normal, platelets are comparable with past studies.  Advised to let us know if this pain persist after next menses.

## 2014-05-29 NOTE — Progress Notes (Signed)
Subjective:     Patient ID: Amber Yu, female   DOB: October 05, 1980, 34 y.o.   MRN: [id number]  HPI This 34 yo G0P0 MW Fe complain of lower pelvic pain for a month with increase in symptoms for week.  Pain is in the low pelvic region. Initially bilateral then  More right lower to mid pelvic region.  Similar feeling like menstrual related.  She had an ovarian cyst last summer and states pain is not like that at all.   No constipation, diarrhea, N/V, fever/chills.  Some urinary symptoms with incomplete emptying of bladder.  No dysuria, urgency, frequency.  Has increased fluids with no decrease in pain.  last BM this am without changes, denies blood or mucus. Taking OTC NSAID's for pain with relief especially at hs to sleep. No nocturia.  LMP 05/07/14.  Not on birth control since 09/2013. Using condoms currently until the fall and plans are to try again for a pregnancy. Has apt. on 08/06/14 with Dr. Carmela Rima.  She has had a slight elevated FSH.   Recent yeast vaginitis has improved.   Review of Systems  Constitutional: Negative for chills, diaphoresis, appetite change and fatigue.  HENT: Negative.   Respiratory: Negative.   Cardiovascular: Negative.   Gastrointestinal: Negative for nausea, vomiting, abdominal pain, diarrhea, constipation, blood in stool, abdominal distention and anal bleeding.  Genitourinary: Positive for decreased urine volume and pelvic pain. Negative for dysuria, urgency, frequency, hematuria, flank pain, vaginal bleeding, vaginal discharge, genital sores, vaginal pain, menstrual problem and dyspareunia.  Musculoskeletal: Negative.   Skin: Negative.   Neurological: Negative.   Hematological:       History of Spherocytosis with Splenectomy 1987  Psychiatric/Behavioral: Negative.        Objective:   Physical Exam  Constitutional: She is oriented to person, place, and time. She appears well-developed and well-nourished. No distress.  Abdominal: Soft. Bowel sounds are normal. She  exhibits no distension and no mass. There is tenderness. There is no rebound and no guarding.  Genitourinary:  Normal vaginal discharge. No pain at the urethra.  No mass on bimanual.  There was pain on deep palpation on the right. No similar pain on rectal exam.  Neurological: She is alert and oriented to person, place, and time.  Skin: Skin is warm and dry.  Psychiatric: She has a normal mood and affect. Her behavior is normal. Judgment and thought content normal.       Assessment:     Right pelvic pain X 1 month worse X 1 week R/O UTI R/O vaginitis as cause History of spherocytosis with S/P Splenectomy 1987    Plan:     Will get Stat CBC and Follow Follow with Affirm test and urine Culture Continue with Advil prn If related to menses then hopefully symptoms will go away with upcoming menses

## 2014-05-29 NOTE — Patient Instructions (Signed)
Will call you with test results.

## 2014-05-30 LAB — URINALYSIS, MICROSCOPIC ONLY
BACTERIA UA: NONE SEEN
CRYSTALS: NONE SEEN
Casts: NONE SEEN
SQUAMOUS EPITHELIAL / LPF: NONE SEEN

## 2014-05-30 LAB — WET PREP BY MOLECULAR PROBE
Candida species: NEGATIVE
GARDNERELLA VAGINALIS: NEGATIVE
Trichomonas vaginosis: NEGATIVE

## 2014-05-31 LAB — URINE CULTURE
COLONY COUNT: NO GROWTH
ORGANISM ID, BACTERIA: NO GROWTH

## 2014-06-01 NOTE — Progress Notes (Signed)
Encounter reviewed by Dr. Brook Silva.  

## 2014-07-28 ENCOUNTER — Telehealth: Payer: Self-pay | Admitting: Obstetrics & Gynecology

## 2014-07-28 NOTE — Telephone Encounter (Signed)
Patient has been having abdominal bloating for 4 days. Last seen 05/29/14.

## 2014-07-28 NOTE — Telephone Encounter (Signed)
Spoke with patient. She states she is experiencing 4 days of abdominal bloating and pain that radiates from lower pelvic area up in to her stomach. She states she was feeling nauseated for two days, but no nausea today, no vomiting, no fevers, or GI symptoms.  LMP 07/06/14. Uses condoms for birth control. Patient with history of ovarian cyst.   Advised would review message with Dr. Sabra Heck and would return call with appointment. Patient agreeable.  Reviewed with nursing supervisor, Lamont Snowball, RN and requests that message be sent to provider to see if needs office visit first, or can add for ultrasound schedule tomorrow.

## 2014-07-29 ENCOUNTER — Ambulatory Visit (INDEPENDENT_AMBULATORY_CARE_PROVIDER_SITE_OTHER): Payer: BLUE CROSS/BLUE SHIELD | Admitting: Obstetrics & Gynecology

## 2014-07-29 ENCOUNTER — Ambulatory Visit (INDEPENDENT_AMBULATORY_CARE_PROVIDER_SITE_OTHER): Payer: BLUE CROSS/BLUE SHIELD

## 2014-07-29 VITALS — BP 118/68 | HR 80 | Resp 20 | Wt 171.8 lb

## 2014-07-29 DIAGNOSIS — N83201 Unspecified ovarian cyst, right side: Secondary | ICD-10-CM

## 2014-07-29 DIAGNOSIS — N938 Other specified abnormal uterine and vaginal bleeding: Secondary | ICD-10-CM | POA: Diagnosis not present

## 2014-07-29 DIAGNOSIS — R110 Nausea: Secondary | ICD-10-CM | POA: Diagnosis not present

## 2014-07-29 DIAGNOSIS — R1031 Right lower quadrant pain: Secondary | ICD-10-CM

## 2014-07-29 DIAGNOSIS — N8320 Unspecified ovarian cysts: Secondary | ICD-10-CM

## 2014-07-29 DIAGNOSIS — N7011 Chronic salpingitis: Secondary | ICD-10-CM | POA: Diagnosis not present

## 2014-07-29 LAB — POCT URINALYSIS DIPSTICK
Bilirubin, UA: NEGATIVE
Glucose, UA: NEGATIVE
Ketones, UA: NEGATIVE
Leukocytes, UA: NEGATIVE
Nitrite, UA: NEGATIVE
PH UA: 5
PROTEIN UA: NEGATIVE
RBC UA: NEGATIVE
UROBILINOGEN UA: NEGATIVE

## 2014-07-29 LAB — POCT URINE PREGNANCY: PREG TEST UR: NEGATIVE

## 2014-07-29 NOTE — Progress Notes (Signed)
Subjective:     Patient ID: Amber Yu, female   DOB: [birth year], 34 y.o.   MRN: [id number]  HPI 34 yo G0 here for 5 day history of increased bloating with associated nausea and menstrual cramping.  Reports bowel function hasn't really change except a little more frequently over the past five days.  Spotted a little bit yesterday.  Denies vaginal odor.  Was anxious about discomfort so called for appointment.  Denies fever.  No bladder function changes.  LMP 07/06/14.    Appt with Dr. Kerin Perna June 29.  Has moderately elevated FSH and I think is important for her to proceed with attempting pregnancy if this is her desire.  It finally is.  She is just anxious about possibility of needing to do high tech options.  Review of Systems  All other systems reviewed and are negative.      Objective:   Physical Exam  Constitutional: She is oriented to person, place, and time. She appears well-developed and well-nourished.  Abdominal: Soft. Bowel sounds are normal. She exhibits no distension and no mass. There is tenderness (mild in RLQ). There is no rebound and no guarding.  Genitourinary: Vagina normal and uterus normal. There is no rash, tenderness, lesion or injury on the right labia. There is no rash, tenderness, lesion or injury on the left labia. Uterus is not enlarged, not fixed and not tender. Cervix exhibits no motion tenderness and no discharge. Right adnexum displays tenderness. Right adnexum displays no mass and no fullness. Left adnexum displays no mass, no tenderness and no fullness.  Lymphadenopathy:       Right: No inguinal adenopathy present.       Left: No inguinal adenopathy present.  Neurological: She is alert and oriented to person, place, and time.  Skin: Skin is warm and dry.  Psychiatric: She has a normal mood and affect.   Due to tenderness on right side with abdominal and pelvic exam, recommended proceeding with PUS which could be done today in office.  Uterus 7.9 x 4.8  x 4.0cm without fibroids Endometrium 9.4mm Adnexa:  Right ovary 4.4 x 3.0 x 3.0cm with 2.8 x 2.6cm cyst with debris, possible resolving corpus luteum     Left ovary 3.0 x 1.7 x 1.8cm with adjacent hydrosalpinx 3.4 x 1.3cm.  Stable since prior exam Cul de sac:  Small amt of free fluid noted  Reviewed images with pt today.  All questions answered.     Assessment:     RLQ pain/tenderness Probable resolving corpus luteal cyst, 2.8cm, on right Left hydrosalpinx Mildly elevated FSH  Hereditary spherocytosis    Plan:     Pt has appt with Dr. Kerin Perna 6//29/16 for recommendations  D/W pt the cyst is typically self limiting and will resolve.  OTC anti-inflammatories discussed.  Pt doesn't feel she needs them, just needed to know what was causing pain. Checking HCG just to be sure as well.  ~In addition to history and physical today, ultrasound was discussed as well as recommendations.  About 25 minutes total was spent with patient with >50% of time was in face to face discussion.

## 2014-07-29 NOTE — Telephone Encounter (Signed)
Call to patient. States today bloating and cramping continue.  Regular BM's no constipation. Motrin helps cramps. Tums does not help bloating. Had light spotting last night that has resolved. Also complains of right side "twinge" like previous ovarian cyst. Patient states attempting pregnancy. LMP 07-06-14.  Patient concerned that these symptoms are very unusual for her and she needs to be checked before going out of town.  Advised that appointment would be a work in and may have a wait to be seen. Advised to arrive at [place] to provider for final review. Ultrasound appointment is available at 230 if provider desires.

## 2014-07-30 LAB — HCG, SERUM, QUALITATIVE: Preg, Serum: NEGATIVE

## 2014-08-08 ENCOUNTER — Encounter: Payer: Self-pay | Admitting: Obstetrics & Gynecology

## 2014-08-08 DIAGNOSIS — N7011 Chronic salpingitis: Secondary | ICD-10-CM | POA: Insufficient documentation

## 2014-12-23 ENCOUNTER — Other Ambulatory Visit: Payer: Self-pay | Admitting: Obstetrics and Gynecology

## 2015-02-27 ENCOUNTER — Ambulatory Visit (HOSPITAL_BASED_OUTPATIENT_CLINIC_OR_DEPARTMENT_OTHER): Payer: BLUE CROSS/BLUE SHIELD | Admitting: Hematology & Oncology

## 2015-02-27 ENCOUNTER — Other Ambulatory Visit (HOSPITAL_BASED_OUTPATIENT_CLINIC_OR_DEPARTMENT_OTHER): Payer: BLUE CROSS/BLUE SHIELD

## 2015-02-27 ENCOUNTER — Encounter: Payer: Self-pay | Admitting: Hematology & Oncology

## 2015-02-27 VITALS — BP 122/73 | HR 103 | Temp 97.9°F | Resp 18 | Ht 63.0 in | Wt 180.0 lb

## 2015-02-27 DIAGNOSIS — D580 Hereditary spherocytosis: Secondary | ICD-10-CM | POA: Diagnosis not present

## 2015-02-27 LAB — CBC WITH DIFFERENTIAL (CANCER CENTER ONLY)
BASO#: 0 10*3/uL (ref 0.0–0.2)
BASO%: 0.1 % (ref 0.0–2.0)
EOS%: 0.5 % (ref 0.0–7.0)
Eosinophils Absolute: 0.1 10*3/uL (ref 0.0–0.5)
HCT: 36.3 % (ref 34.8–46.6)
HGB: 12.7 g/dL (ref 11.6–15.9)
LYMPH#: 3.8 10*3/uL — AB (ref 0.9–3.3)
LYMPH%: 18.4 % (ref 14.0–48.0)
MCH: 29.6 pg (ref 26.0–34.0)
MCHC: 35 g/dL (ref 32.0–36.0)
MCV: 85 fL (ref 81–101)
MONO#: 1.8 10*3/uL — AB (ref 0.1–0.9)
MONO%: 8.6 % (ref 0.0–13.0)
NEUT%: 72.4 % (ref 39.6–80.0)
NEUTROS ABS: 15 10*3/uL — AB (ref 1.5–6.5)
PLATELETS: 733 10*3/uL — AB (ref 145–400)
RBC: 4.29 10*6/uL (ref 3.70–5.32)
RDW: 13.2 % (ref 11.1–15.7)
WBC: 20.7 10*3/uL — AB (ref 3.9–10.0)

## 2015-02-27 LAB — FERRITIN: FERRITIN: 90 ng/mL (ref 9–269)

## 2015-02-27 LAB — IRON AND TIBC
%SAT: 22 % (ref 21–57)
Iron: 84 ug/dL (ref 41–142)
TIBC: 388 ug/dL (ref 236–444)
UIBC: 304 ug/dL (ref 120–384)

## 2015-02-27 LAB — CHCC SATELLITE - SMEAR

## 2015-02-27 MED ORDER — FOLIC ACID 1 MG PO TABS: 2.0000 mg | ORAL_TABLET | Freq: Every day | ORAL | Status: AC

## 2015-02-27 NOTE — Progress Notes (Signed)
Referral MD  Reason for Referral: Twin pregnancies in a patient with spherocytosis and splenectomy.   Chief Complaint  Patient presents with  . OTHER    New Patient  : I'm having twins. My white cell count platelet count are elevated.  HPI: Ms. Amber Yu is a very charming 35 year old white female. 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  I saw her back in November 2015.  At that time, I did a complete battery of genetic markers for MPN disease. Everything came back normal. I felt that her leukocytosis and thrombocytosis were reactive from her splenectomy.  She is now pregnant with twins. She is [redacted] weeks pregnant. She is due in early July.  Her white cell count and platelet count have been elevated. Her gynecologist felt that she needed to be seen by [place] again to make sure nothing else was going on.  Back in in December, her white cell count was 19.1.. Hemoglobin 13.4. Platelet count was 726,000. She had a normal white cell differential. Her MCV was 84.  She's done well so far. She's had no problems with the pregnancy. She had little bit of nausea. She's had no vomiting.  She is on prenatal vitamins. I think she fine needs full dose of folic acid given her spherocytosis.  She's had no rashes. She's had no fever. She had no change in bowel or bladder habits.  We're asked to see her to help with any management issues of her blood disease while pregnant.  Of note, she got pregnant after taking Femara. She went to a fertility clinic. She had 1 dose of Femara and then got pregnant.  She does not smoke. She does not drink.    Past Medical History  Diagnosis Date  . Hypotension, postural     Pt. placed on Zoloft for this problem - off currently  . Spherocytosis, hereditary ([place]) 1985    family members also have, father and 2 sisters and 2 nieces  . Herniated disc, cervical 07/2012    treated with 4 epidural injections  :  Past Surgical History   Procedure Laterality Date  . 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 Ankle fracture surgery Left 1999  :   Current outpatient prescriptions:  .  Prenatal Vit-Fe Fumarate-FA (PRENATAL VITAMIN PO), Take by mouth daily., Disp: , Rfl:  .  folic acid (FOLVITE) 1 MG tablet, Take 2 tablets (2 mg total) by mouth daily., Disp: 60 tablet, Rfl: 8:  :  Allergies  Allergen Reactions  . Minocycline Other (See Comments)    dizziness  . Latex Rash    Adhesive on latex band aids   :  Family History  Problem Relation Age of Onset  . Heart disease Mother   . Other Sister     spherocytosis  . Other Sister     spherocytosis  :  Social History   Social History  . Marital Status: Married    Spouse Name: N/A  . Number of Children: 0  . Years of Education: N/A   Occupational History  .     Social History Main Topics  . Smoking status: Never Smoker   . Smokeless tobacco: Never Used  . Alcohol Use: 0.6 oz/week    1 Standard drinks or equivalent per week     Comment: once per month  . Drug Use: No  . Sexual Activity:    Partners: Male    Birth Control/ Protection: Pill  Other Topics Concern  . Not on file   Social History Narrative  :  Pertinent items are noted in HPI.  Exam: @IPVITALS @  pregnant white female in no obvious distress. Vital signs are temperature of 97.9. Pulse 103. Blood pressure 122/73. Weight is 180 pounds. Head and neck exam shows no ocular or oral lesions. She has no palpable cervical or supraclavicular lymph nodes. Lungs are clear bilaterally. Cardiac exam regular rate and rhythm with no murmurs, rubs or bruits. Abdomen is soft. She is barely pregnant. She has good bowel sounds. There is no palpable hepatomegaly. Her splenectomy scar is well-healed. Back exam shows no tenderness over the spine, ribs or hips. Extremities shows no clubbing, cyanosis or edema. Skin exam shows no rashes, ecchymoses or petechia. Neurological exam shows no focal  neurological deficits.   Recent Labs  02/27/15 1056  WBC 20.7*  HGB 12.7  HCT 36.3  PLT 733*   No results for input(s): NA, K, CL, CO2, GLUCOSE, BUN, CREATININE, CALCIUM in the last 72 hours.  Blood smear review:  Normochromic and normocytic population of red blood cells. I see no nucleated red blood cells. She has some inclusion bodies in the red cells. I see no target cells. She has no teardrop cells. I see no schistocytes or spherocytes. White cells are increased in number. She has mature polys. She may have a few hypersegmented polys. I see a couple metamyelocytes. I do not see any blasts. Platelets are increased in number. Platelets are well granulated. She has a few large platelets.  Pathology: None     Assessment and Plan:  Amber Yu is a very charming 35 year old white female. She has hereditary spherocytosis. She has had her spleen taken out.  I really believe that the leukocytosis and thrombocytosis are all reactive. Again, we saw her 1 and a half years ago, I did the genetic markers for myeloproliferative neoplasms which were all normal. As such, I think this is very sensitive in ruling out any type of bone marrow disorder.  I do believe that she will need full dose folic acid. I think that some of the studies have looked at hereditary erythrocytosis and pregnancy may show a little bit more anemia.  There is not many studies that have looked at spherocytosis with pregnancy. There are not many studies have looked at pertinent women who've had a spleens taken out.  I just would not think that there would be an increased risk to Ms. [place] or her twin fetuses.  I do think we have to follow her closely. I will like to see her back in a couple months.  I don't think we need any additional studies on her right now.  It's hard to say how her white cell count or platelet count will tend to trend. Typically, with pregnancy and with spleens, there is a thrombocytopenia as there is  sequestration within the spleen.  I spent about an hour with she and her husband. It was nice to see her again. I'm is very happy for her. We will certainly be a very diligent in following her along.

## 2015-02-28 LAB — RETICULOCYTES: RETICULOCYTE COUNT: 1.9 % (ref 0.6–2.6)

## 2015-02-28 LAB — HAPTOGLOBIN: HAPTOGLOBIN: 110 mg/dL (ref 34–200)

## 2015-03-02 LAB — VON WILLEBRAND PANEL
Factor VIII Activity: 185 % — ABNORMAL HIGH (ref 57–163)
PDF IMAGE: 0
vWF Activity: 178 % (ref 50–200)
von Willebrand Factor (vWF) Ag: 162 % (ref 50–200)

## 2015-04-16 ENCOUNTER — Other Ambulatory Visit: Payer: Self-pay | Admitting: *Deleted

## 2015-04-16 DIAGNOSIS — D580 Hereditary spherocytosis: Secondary | ICD-10-CM

## 2015-04-17 ENCOUNTER — Encounter: Payer: Self-pay | Admitting: Hematology & Oncology

## 2015-04-17 ENCOUNTER — Ambulatory Visit (HOSPITAL_BASED_OUTPATIENT_CLINIC_OR_DEPARTMENT_OTHER): Payer: BLUE CROSS/BLUE SHIELD | Admitting: Hematology & Oncology

## 2015-04-17 ENCOUNTER — Other Ambulatory Visit (HOSPITAL_BASED_OUTPATIENT_CLINIC_OR_DEPARTMENT_OTHER): Payer: BLUE CROSS/BLUE SHIELD

## 2015-04-17 VITALS — BP 128/80 | HR 93 | Temp 97.6°F | Resp 18 | Ht 63.0 in | Wt 193.0 lb

## 2015-04-17 DIAGNOSIS — O26892 Other specified pregnancy related conditions, second trimester: Secondary | ICD-10-CM | POA: Diagnosis not present

## 2015-04-17 DIAGNOSIS — D473 Essential (hemorrhagic) thrombocythemia: Secondary | ICD-10-CM | POA: Diagnosis not present

## 2015-04-17 DIAGNOSIS — D580 Hereditary spherocytosis: Secondary | ICD-10-CM

## 2015-04-17 LAB — CBC WITH DIFFERENTIAL (CANCER CENTER ONLY)
BASO#: 0 10*3/uL (ref 0.0–0.2)
BASO%: 0.2 % (ref 0.0–2.0)
EOS%: 0.6 % (ref 0.0–7.0)
Eosinophils Absolute: 0.1 10*3/uL (ref 0.0–0.5)
HEMATOCRIT: 35.4 % (ref 34.8–46.6)
HEMOGLOBIN: 12.4 g/dL (ref 11.6–15.9)
LYMPH#: 3.6 10*3/uL — AB (ref 0.9–3.3)
LYMPH%: 18 % (ref 14.0–48.0)
MCH: 29.8 pg (ref 26.0–34.0)
MCHC: 35 g/dL (ref 32.0–36.0)
MCV: 85 fL (ref 81–101)
MONO#: 1.7 10*3/uL — AB (ref 0.1–0.9)
MONO%: 8.3 % (ref 0.0–13.0)
NEUT#: 14.6 10*3/uL — ABNORMAL HIGH (ref 1.5–6.5)
NEUT%: 72.9 % (ref 39.6–80.0)
Platelets: 764 10*3/uL — ABNORMAL HIGH (ref 145–400)
RBC: 4.16 10*6/uL (ref 3.70–5.32)
RDW: 13.1 % (ref 11.1–15.7)
WBC: 20 10*3/uL — ABNORMAL HIGH (ref 3.9–10.0)

## 2015-04-17 LAB — CHCC SATELLITE - SMEAR

## 2015-04-17 NOTE — Progress Notes (Signed)
Hematology and Oncology Follow Up Visit  Amber Yu [id number] [birth year] 35 y.o. 04/17/2015   Principle Diagnosis:   Twin pregnancy -status post splenectomy for hereditary spherocytosis  Current Therapy:    Folic acid 2 mg by mouth daily  Aspirin 81 mg by mouth daily     Interim History:  Amber Yu is back for follow-up. We first saw her back in mid January. She actually now lives in [place]. Her family is there. It will help her out a whole lot when she has her twins for her family to be close by. As such, she drove down to see [place].  She is doing well. She is due July 4 and she thinks she probably will go a week or 2 early.  We saw her back in January, we did some routine lab work on her. Her von Willebrand levels were actually a little elevated. She has thrombocytosis and leukocytosis. Again, this is all reactive since she has had a splenectomy.  So far, the babies are doing well. She knows the gender of the babies. I do not want to know.  I think it probably will be helpful for her to see a hematologist up in [place]. I would think that the obstetrician in [place] would like for that to happen. I don't know of anybody particularly.  She's had no problems with bowels or bladder. She's had no leg swelling. She's had no rashes. She's had no fever. She's had no cough.  She sees her obstetrician next week.  Overall, her performance status is ECOG 0.    Medications:  Current outpatient prescriptions:  .  folic acid (FOLVITE) 1 MG tablet, Take 2 tablets (2 mg total) by mouth daily., Disp: 60 tablet, Rfl: 8 .  Prenatal Vit-Fe Fumarate-FA (PRENATAL VITAMIN PO), Take by mouth daily., Disp: , Rfl:  .  ranitidine (ZANTAC) 150 MG tablet, , Disp: , Rfl: 3  Allergies:  Allergies  Allergen Reactions  . Minocycline Other (See Comments)    dizziness  . Latex Rash    Adhesive on latex band aids     Past Medical History, Surgical history, Social history, and Family  History were reviewed and updated.  Review of Systems: As above  Physical Exam:  height is 5\' 3"  (1.6 m) and weight is 193 lb (87.544 kg). Her oral temperature is 97.6 F (36.4 C). Her blood pressure is 128/80 and her pulse is 93. Her respiration is 18.   Wt Readings from Last 3 Encounters:  04/17/15 193 lb (87.544 kg)  02/27/15 180 lb (81.647 kg)  07/29/14 171 lb 12.8 oz (77.928 kg)     Pertinent white female in no obvious distress. Head and neck exam shows no ocular or oral lesions. She has no scleral icterus. There is no adenopathy in the neck. Lungs are clear bilaterally. Cardiac exam regular rate and rhythm with no murmurs, rubs or bruits. Abdomen is soft. She is slightly pregnant. There is no guarding or rebound tenderness. She has these B scar as well-healed. She has no palpable hepatomegaly. Back exam shows no tenderness over the spine, ribs or hips. Extremities shows no clubbing, cyanosis or edema. Neurological exam shows no focal neurological deficits. Skin exam shows some small areas of pustular lesions on her back.   Lab Results  Component Value Date   WBC 20.0* 04/17/2015   HGB 12.4 04/17/2015   HCT 35.4 04/17/2015   MCV 85 04/17/2015   PLT 764* 04/17/2015     Chemistry  Component Value Date/Time   NA 141 12/09/2013 1221   K 3.8 12/09/2013 1221   CL 103 12/09/2013 1221   CO2 22 12/09/2013 1221   BUN 9 12/09/2013 1221   CREATININE 0.80 12/09/2013 1221      Component Value Date/Time   CALCIUM 9.8 12/09/2013 1221   ALKPHOS 48 12/09/2013 1221   AST 18 12/09/2013 1221   ALT 13 12/09/2013 1221   BILITOT 0.9 12/09/2013 1221         Impression and Plan: Amber Yu is a 35 year old white female who is regnant. She has twins. She is due July 4.  She has had a splenectomy. This is for hereditary spherocytosis. She has had leukocytosis and thrombocytosis. I looked at her blood smear. She has good maturity of her white blood cells and platelets. I do not see any  immature myeloid or lymphoid cells. I see no hypersegmented polys. Platelets are well granulated.  I think with her thrombocytosis, it would be helpful to have her on baby aspirin for right now. I think that this will help provide a little bit of "anticoagulation" that would be beneficial for her pregnancy. We checked her von Willebrand levels. They all look great.  I told her to take the baby aspirin in the morning when she is up and about. I told her to make sure she takes aspirin with food.  It is said that we will not be oh to see her through the pregnancy. However, it will be somewhat easier for her to be seen up in [place]. I did state that she has to drive down here to see [place]. She is willing to do that but I told her that it would just be somewhat more logical for her to be seen locally, particularly when she begins to have more symptoms from her pregnancy.  I'll willing to help her anyway possible. If her obstetrician feels that a hematologist would be helpful, I will be more than happy to provide records.  I spent about 35 minutes with her today. It is sad that she will be staying locally in [place] but yet I think this is somewhat easier for her.  I told her to make sure that she sends [place] pictures of the babies when they are born.            [name], MD 3/10/201711:24 AM

## 2015-05-24 IMAGING — CT CT ABD-PELV W/ CM
3 of 4 series · 13 of 36 positions shown, 19 images · IV contrast (READICAT/WATER & [ID] OMNI 300)
Comparison: None.

CLINICAL DATA: Right lower quadrant and pelvic pain for 6 days.
Abdominal bloating and nausea. Elevated white blood cell count.
History of spherocytosis with prior splenectomy.

EXAM:
CT ABDOMEN AND PELVIS WITH CONTRAST
TECHNIQUE: Multidetector CT imaging of the abdomen and pelvis was performed
using the standard protocol following bolus administration of
intravenous contrast.
CONTRAST:  125mL OMNIPAQUE IOHEXOL 300 MG/ML  SOLN

[Series 3: abd/pelvis with · axial · 0.78mm/px · z∈[-366,-40]mm · 8 of 83 slices shown, 13 images]
[im 10/83  soft-tissue]
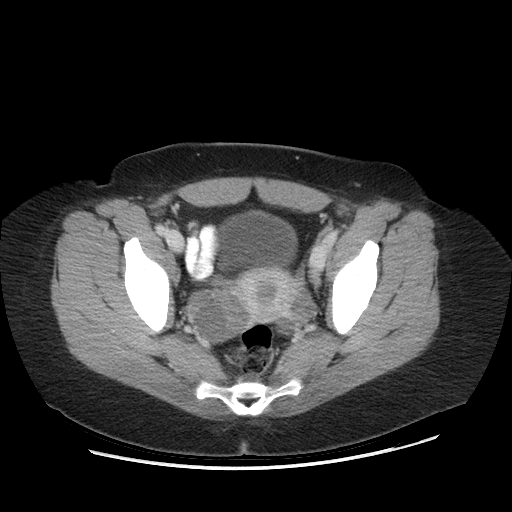
[im 10/83  bone]
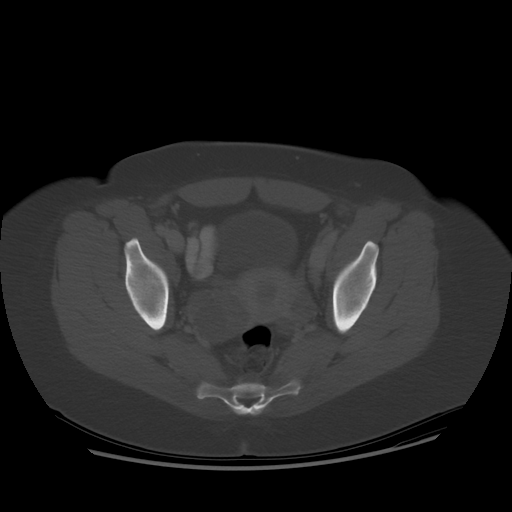
[im 19/83  soft-tissue]
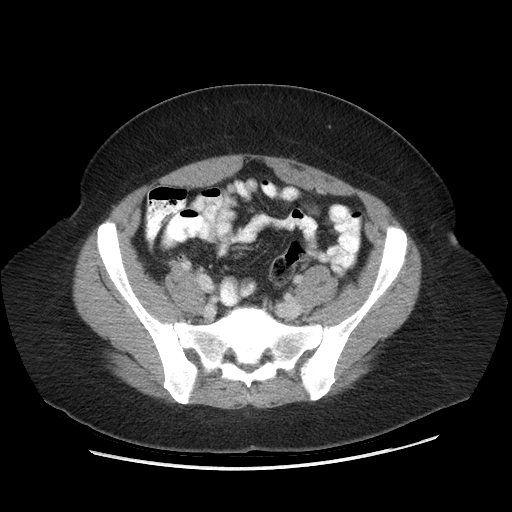
[im 28/83  soft-tissue]
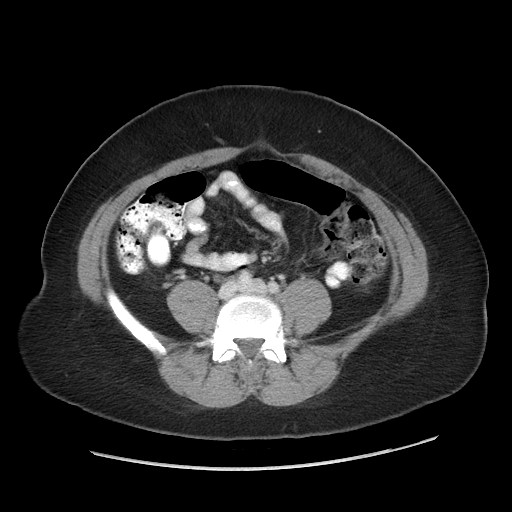
[im 37/83  soft-tissue]
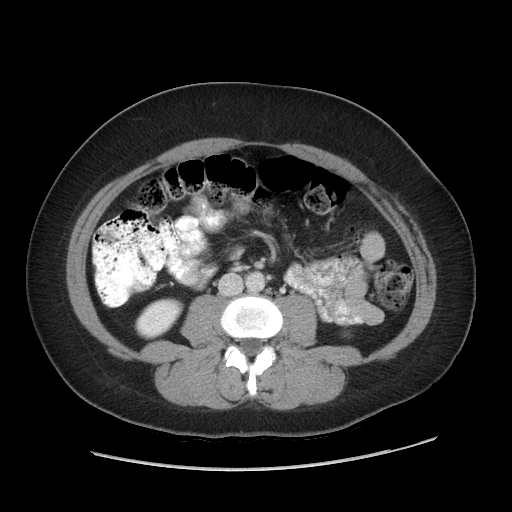
[im 46/83  soft-tissue]
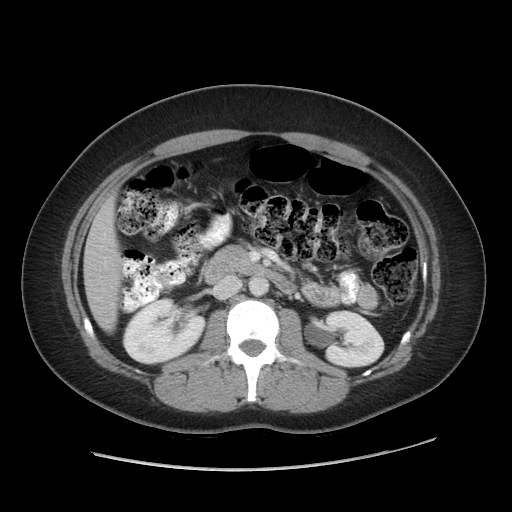
[im 46/83  lung]
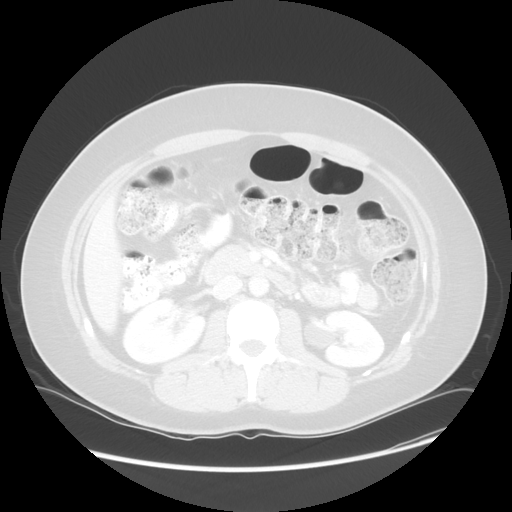
[im 55/83  soft-tissue]
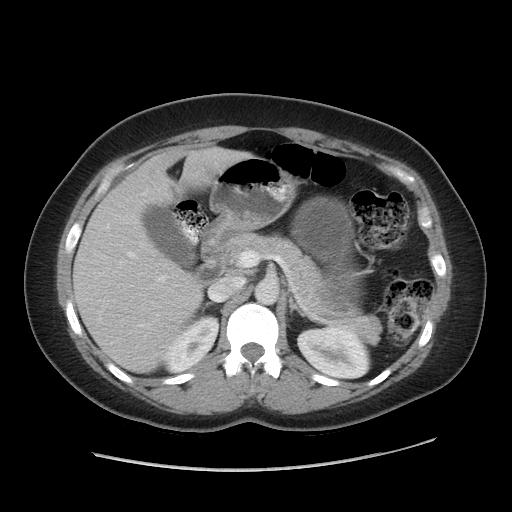
[im 55/83  lung]
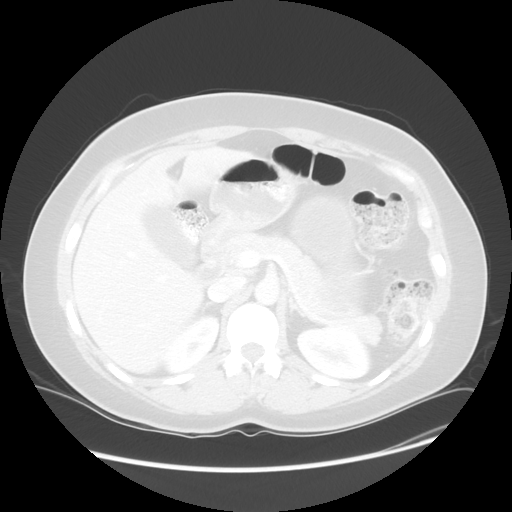
[im 64/83  soft-tissue]
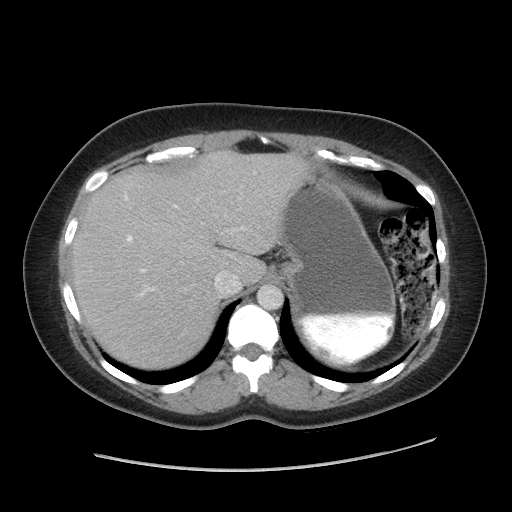
[im 64/83  lung]
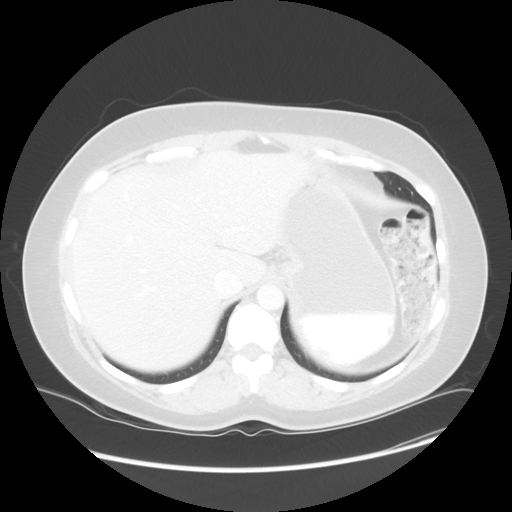
[im 73/83  soft-tissue]
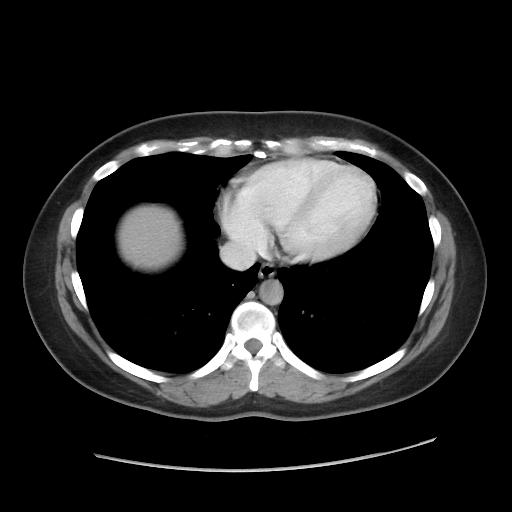
[im 73/83  lung]
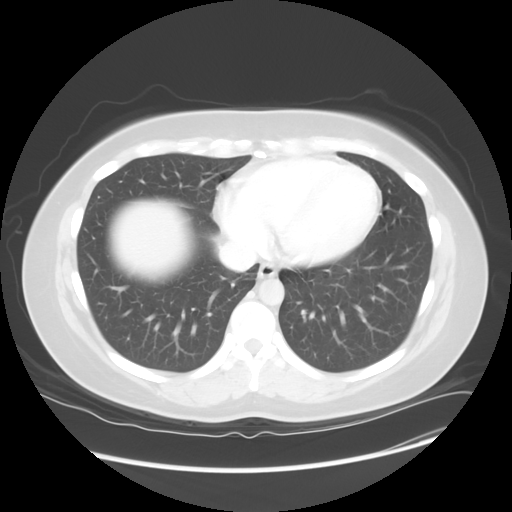

[Series 601: coronal body · coronal · 0.90mm/px · 1 of 121 slices shown, 2 images]
[im 41/121  soft-tissue]
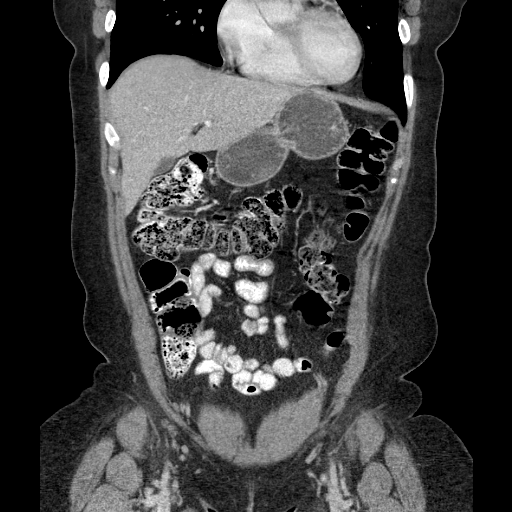
[im 41/121  bone]
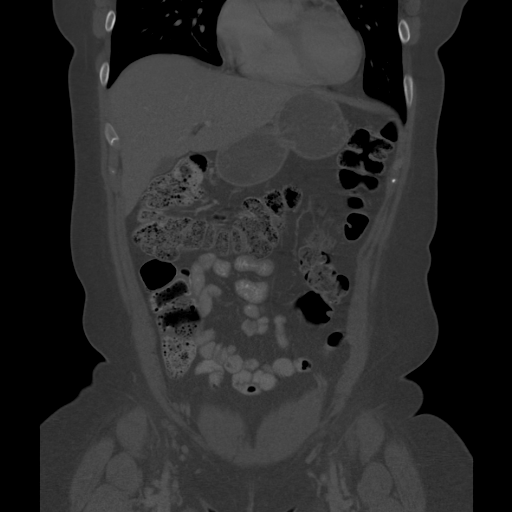

[Series 602: sagittal body · sagittal · 0.90mm/px · 4 of 161 slices shown]
[im 18/161  soft-tissue]
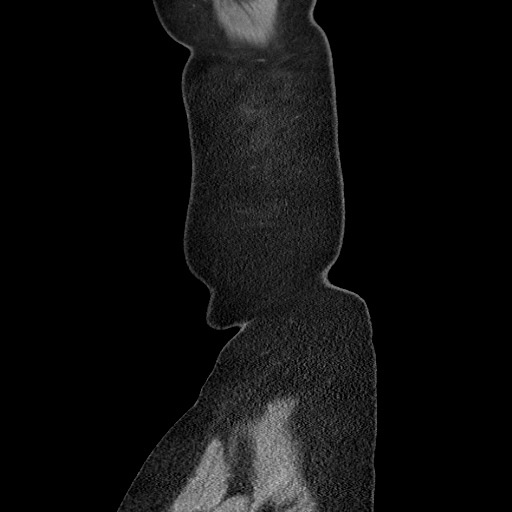
[im 36/161  soft-tissue]
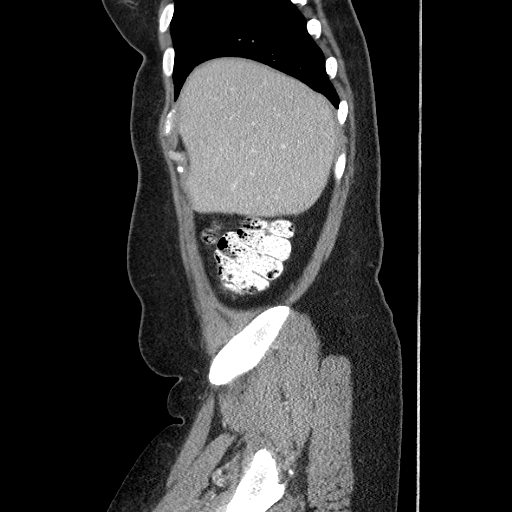
[im 54/161  soft-tissue]
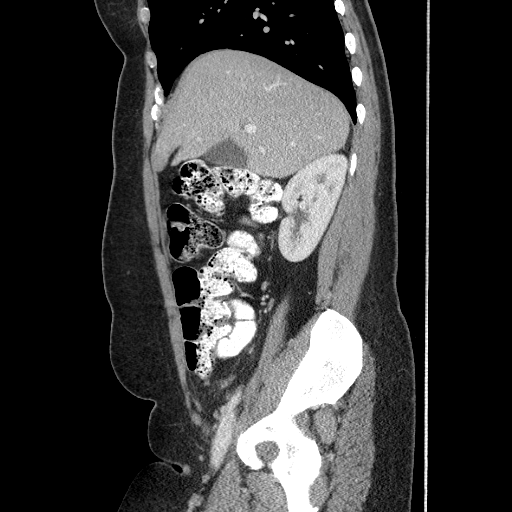
[im 72/161  soft-tissue]
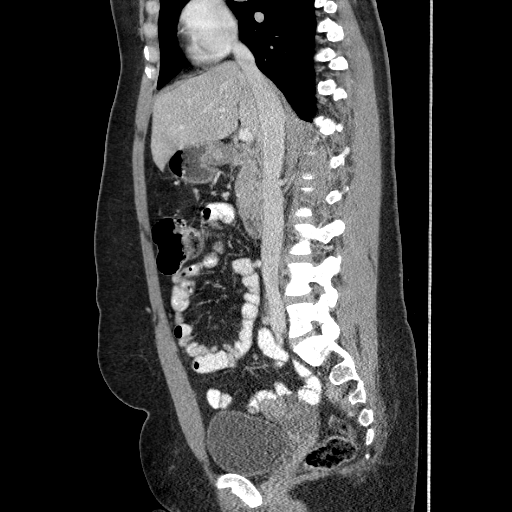

[13 of 36 positions shown; findings below may reference images not displayed]

FINDINGS: Visualized lung bases are clear.

The spleen is absent. The liver, gallbladder, adrenal glands,
kidneys, and pancreas have an unremarkable enhanced appearance.

Oral contrast is present in nondilated loops of small bowel to the
level of the proximal colon without evidence of obstruction. There
is a moderate amount of stool in the colon. The appendix is
identified in the right lower quadrant and is unremarkable.

Uterus and ovaries are identified. There is a low-density 3.6 cm
right ovarian lesion with appearance suggestive of a cyst, however
it measures greater than simple fluid in attenuation. The left ovary
is unremarkable. Bladder is unremarkable. No free fluid or enlarged
lymph nodes are identified. Osseous structures are unremarkable.
IMPRESSION: 1. No evidence of appendicitis or other acute abnormality in the
abdomen or pelvis.
[DATE] cm right ovarian lesion. This has an overall benign
appearance and may represent a hemorrhagic cyst. This could be
further evaluated with a follow-up pelvic ultrasound in 6-12 weeks
unless more immediate characterization is clinically necessary based
on symptoms.

## 2015-05-25 ENCOUNTER — Telehealth: Payer: Self-pay | Admitting: Nurse Practitioner

## 2015-05-25 NOTE — Telephone Encounter (Signed)
OK we wish her well.

## 2015-05-25 NOTE — Telephone Encounter (Signed)
Called patient to remind of appointment for [facility]EX on 05/26/15 and she informed me she is now living in [place] and will continue care there. FYI only.

## 2015-05-26 ENCOUNTER — Ambulatory Visit: Payer: BLUE CROSS/BLUE SHIELD | Admitting: Nurse Practitioner
